# Patient Record
Sex: Female | Born: 1985 | Hispanic: No | Marital: Single | Smoking: Never smoker
Health system: Southern US, Community
[De-identification: ages and names within clinical notes are randomized; demographics above are authoritative.]

## PROBLEM LIST (undated history)

## (undated) DIAGNOSIS — N920 Excessive and frequent menstruation with regular cycle: Secondary | ICD-10-CM

## (undated) DIAGNOSIS — N390 Urinary tract infection, site not specified: Secondary | ICD-10-CM

## (undated) HISTORY — DX: Urinary tract infection, site not specified: N39.0

## (undated) HISTORY — DX: Excessive and frequent menstruation with regular cycle: N92.0

---

## 2014-11-18 ENCOUNTER — Ambulatory Visit (INDEPENDENT_AMBULATORY_CARE_PROVIDER_SITE_OTHER): Payer: Medicaid Other | Admitting: Certified Nurse Midwife

## 2014-11-18 ENCOUNTER — Encounter: Payer: Self-pay | Admitting: Certified Nurse Midwife

## 2014-11-18 VITALS — BP 103/67 | HR 86 | Resp 18 | Ht 63.0 in | Wt 115.0 lb

## 2014-11-18 DIAGNOSIS — Z3009 Encounter for other general counseling and advice on contraception: Secondary | ICD-10-CM | POA: Diagnosis not present

## 2014-11-18 DIAGNOSIS — N898 Other specified noninflammatory disorders of vagina: Secondary | ICD-10-CM | POA: Diagnosis not present

## 2014-11-18 MED ORDER — NORETHIN-ETH ESTRAD-FE BIPHAS 1 MG-10 MCG / 10 MCG PO TABS: 1.0000 | ORAL_TABLET | Freq: Every day | ORAL | Status: DC

## 2014-11-18 MED ORDER — METRONIDAZOLE 500 MG PO TABS: 500.0000 mg | ORAL_TABLET | Freq: Two times a day (BID) | ORAL | Status: DC

## 2014-11-18 NOTE — Progress Notes (Signed)
Pt c/o vaginal discharge with foul smelling odor for a week.  Would also like to discuss birth control options such as the pill.

## 2014-11-18 NOTE — Patient Instructions (Signed)

## 2014-11-18 NOTE — Progress Notes (Signed)
Vaginitis:Sarah Vega 29 y.o. J1P9150 complains of an abnormal vaginal discharge for 1 week. Vaginal symptoms include discharge described as white, malodorous and milky and odor.Vulvar symptoms include none.STI Risk: Very low risk of STD exposureDischarge described as: copious.Other associated symptoms: none.Menstrual pattern: She had been bleeding regularly. Contraception: none Desires to be placed on birth control pills. Review of Systems - Negative except vaginal discharge   BP 103/67 mmHg  Pulse 86  Resp 18  Ht 5\' 3"  (1.6 m)  Wt 115 lb (52.164 kg)  BMI 20.38 kg/m2  LMP 11/05/2014    CONSTITUTIONAL: Well-developed, well-nourished female in no acute distress.  SKIN: Skin is warm and dry. No rash noted. Not diaphoretic. No erythema. No pallor. [place]: Alert and oriented to person, place, and time. Normal reflexes, muscle tone coordination. No cranial nerve deficit noted. PSYCHIATRIC: Normal mood and affect. Normal behavior. Normal judgment and thought content. CARDIOVASCULAR: Normal heart rate noted, regular rhythm RESPIRATORY: Clear to auscultation bilaterally. Effort and breath sounds normal, no problems with respiration noted. PELVIC: Normal appearing external genitalia; normal appearing vaginal mucosa and cervix.  Copious yellowish white discharge noted   Reviewed all forms of birth control options available including abstinence; over the counter/barrier methods; hormonal contraceptive medication including pill, patch, ring, injection,contraceptive implant; hormonal and nonhormonal IUDs; permanent sterilization options including vasectomy and the various tubal sterilization modalities. Risks and benefits reviewed.  Questions were answered.  Information was given to patient to review.   Wet prep showed clue cells consistent with BV, Metronidazole e-prescribed. Patient was called and informed of diagnosis and treatment, and advised to go pick up the prescription. She was counseled  about antabuse reaction that can happen with alcohol and Metronidazole. She was also told to call the clinic with any further questions or further concerns.  Yvonne Kendall CNM

## 2014-11-19 LAB — WET PREP, GENITAL
Trich, Wet Prep: NONE SEEN
Yeast Wet Prep HPF POC: NONE SEEN

## 2015-06-02 ENCOUNTER — Other Ambulatory Visit (HOSPITAL_COMMUNITY)
Admission: RE | Admit: 2015-06-02 | Discharge: 2015-06-02 | Disposition: A | Discharge status: Home / Self Care | Payer: Medicaid Other | Source: Ambulatory Visit | Attending: Obstetrics & Gynecology | Admitting: Obstetrics & Gynecology

## 2015-06-02 ENCOUNTER — Encounter: Payer: Self-pay | Admitting: Obstetrics & Gynecology

## 2015-06-02 ENCOUNTER — Ambulatory Visit (INDEPENDENT_AMBULATORY_CARE_PROVIDER_SITE_OTHER): Payer: Medicaid Other | Admitting: Obstetrics & Gynecology

## 2015-06-02 VITALS — BP 112/73 | HR 89 | Resp 18 | Ht 63.0 in | Wt 124.0 lb

## 2015-06-02 DIAGNOSIS — N760 Acute vaginitis: Secondary | ICD-10-CM

## 2015-06-02 DIAGNOSIS — N898 Other specified noninflammatory disorders of vagina: Secondary | ICD-10-CM | POA: Diagnosis not present

## 2015-06-02 DIAGNOSIS — Z113 Encounter for screening for infections with a predominantly sexual mode of transmission: Secondary | ICD-10-CM | POA: Insufficient documentation

## 2015-06-02 MED ORDER — METRONIDAZOLE 500 MG PO TABS: 500.0000 mg | ORAL_TABLET | Freq: Two times a day (BID) | ORAL | Status: DC

## 2015-06-02 NOTE — Patient Instructions (Signed)
Thank you for enrolling in [facility]. Please follow the instructions below to securely access your online medical record. MyChart allows you to send messages to your doctor, view your test results, manage appointments, and more.   How Do I Sign Up? 1. In your Internet browser, go to [employer] and enter [email or web address]. 2. Click on the Sign Up Now link in the Sign In box. You will see the New Member Sign Up page. 3. Enter your MyChart Access Code exactly as it appears below. You will not need to use this code after you've completed the sign-up process. If you do not sign up before the expiration date, you must request a new code.  MyChart Access Code: [email or web address] Expires: 07/31/2015 10:06 AM  4. Enter your Social Security Number ([id number]) and Date of Birth (mm/dd/yyyy) as indicated and click Submit. You will be taken to the next sign-up page. 5. Create a MyChart ID. This will be your MyChart login ID and cannot be changed, so think of one that is secure and easy to remember. 6. Create a MyChart password. You can change your password at any time. 7. Enter your Password Reset Question and Answer. This can be used at a later time if you forget your password.  8. Enter your e-mail address. You will receive e-mail notification when new information is available in [facility]. 9. Click Sign Up. You can now view your medical record.   Additional Information Remember, MyChart is NOT to be used for urgent needs. For medical emergencies, dial 911.  Vaginitis Vaginitis is an inflammation of the vagina. It is most often caused by a change in the normal balance of the bacteria and yeast that live in the vagina. This change in balance causes an overgrowth of certain bacteria or yeast, which causes the inflammation. There are different types of vaginitis, but the most common types are:  Bacterial vaginosis.  Yeast infection (candidiasis).  Trichomoniasis vaginitis. This is a  sexually transmitted infection (STI).  Viral vaginitis.  Atrophic vaginitis.  Allergic vaginitis. CAUSES  The cause depends on the type of vaginitis. Vaginitis can be caused by:  Bacteria (bacterial vaginosis).  Yeast (yeast infection).  A parasite (trichomoniasis vaginitis)  A virus (viral vaginitis).  Low hormone levels (atrophic vaginitis). Low hormone levels can occur during pregnancy, breastfeeding, or after menopause.  Irritants, such as bubble baths, scented tampons, and feminine sprays (allergic vaginitis). Other factors can change the normal balance of the yeast and bacteria that live in the vagina. These include:  Antibiotic medicines.  Poor hygiene.  Diaphragms, vaginal sponges, spermicides, birth control pills, and intrauterine devices (IUD).  Sexual intercourse.  Infection.  Uncontrolled diabetes.  A weakened immune system. SYMPTOMS  Symptoms can vary depending on the cause of the vaginitis. Common symptoms include:  Abnormal vaginal discharge.  The discharge is white, gray, or yellow with bacterial vaginosis.  The discharge is thick, white, and cheesy with a yeast infection.  The discharge is frothy and yellow or greenish with trichomoniasis.  A bad vaginal odor.  The odor is fishy with bacterial vaginosis.  Vaginal itching, pain, or swelling.  Painful intercourse.  Pain or burning when urinating. Sometimes, there are no symptoms. TREATMENT  Treatment will vary depending on the type of infection.   Bacterial vaginosis and trichomoniasis are often treated with antibiotic creams or pills.  Yeast infections are often treated with antifungal medicines, such as vaginal creams or suppositories.  Viral vaginitis has no cure, but symptoms can be treated with  medicines that relieve discomfort. Your sexual partner should be treated as well.  Atrophic vaginitis may be treated with an estrogen cream, pill, suppository, or vaginal ring. If vaginal  dryness occurs, lubricants and moisturizing creams may help. You may be told to avoid scented soaps, sprays, or douches.  Allergic vaginitis treatment involves quitting the use of the product that is causing the problem. Vaginal creams can be used to treat the symptoms. HOME CARE INSTRUCTIONS   Take all medicines as directed by your caregiver.  Keep your genital area clean and dry. Avoid soap and only rinse the area with water.  Avoid douching. It can remove the healthy bacteria in the vagina.  Do not use tampons or have sexual intercourse until your vaginitis has been treated. Use sanitary pads while you have vaginitis.  Wipe from front to back. This avoids the spread of bacteria from the rectum to the vagina.  Let air reach your genital area.  Wear cotton underwear to decrease moisture buildup.  Avoid wearing underwear while you sleep until your vaginitis is gone.  Avoid tight pants and underwear or nylons without a cotton panel.  Take off wet clothing (especially bathing suits) as soon as possible.  Use mild, non-scented products. Avoid using irritants, such as:  Scented feminine sprays.  Fabric softeners.  Scented detergents.  Scented tampons.  Scented soaps or bubble baths.  Practice safe sex and use condoms. Condoms may prevent the spread of trichomoniasis and viral vaginitis. SEEK MEDICAL CARE IF:   You have abdominal pain.  You have a fever or persistent symptoms for more than 2-3 days.  You have a fever and your symptoms suddenly get worse.   This information is not intended to replace advice given to you by your health care provider. Make sure you discuss any questions you have with your health care provider.   Document Released: 11/27/2006 Document Revised: 06/16/2014 Document Reviewed: 07/13/2011 Elsevier Interactive Patient Education [employer].

## 2015-06-02 NOTE — Progress Notes (Signed)
     CLINIC ENCOUNTER NOTE  History:  30 y.o. G1P1001 here today for vaginal itching and abnormal discharge.  She started experiencing vaginal itching a week ago, was seen in Urgent Care and was presumptively treated with two rounds of Diflucan. Patient is still experiencing the vaginal irritation and now having a white discharge.  She denies any abnormal vaginal bleeding, pelvic pain or other concerns.   Past Medical History  Diagnosis Date  . Frequent UTI     History reviewed. No pertinent past surgical history.  The following portions of the patient's history were reviewed and updated as appropriate: allergies, current medications, past family history, past medical history, past social history, past surgical history and problem list.   Health Maintenance:  Normal pap in 2015.   Review of Systems:  Pertinent items noted in HPI and remainder of comprehensive ROS otherwise negative.  Objective:  Physical Exam BP 112/73 mmHg  Pulse 89  Resp 18  Ht 5\' 3"  (1.6 m)  Wt 124 lb (56.246 kg)  BMI 21.97 kg/m2  LMP 05/19/2015 CONSTITUTIONAL: Well-developed, well-nourished female in no acute distress.  HENT:  Normocephalic, atraumatic. External right and left ear normal. Oropharynx is clear and moist EYES: Conjunctivae and EOM are normal. Pupils are equal, round, and reactive to light. No scleral icterus.  NECK: Normal range of motion, supple, no masses SKIN: Skin is warm and dry. No rash noted. Not diaphoretic. No erythema. No pallor. NEUROLOGIC: Alert and oriented to person, place, and time. Normal reflexes, muscle tone coordination. No cranial nerve deficit noted. PSYCHIATRIC: Normal mood and affect. Normal behavior. Normal judgment and thought content. CARDIOVASCULAR: Normal heart rate noted RESPIRATORY: Effort and breath sounds normal, no problems with respiration noted ABDOMEN: Soft, no distention noted.   PELVIC: Normal appearing external genitalia; normal appearing vaginal mucosa  and cervix.  Copious, thin, yellow malodorous discharge noted; samples obtained.  Normal uterine size, no other palpable masses, no uterine or adnexal tenderness. MUSCULOSKELETAL: Normal range of motion. No edema noted.   Assessment & Plan:  1. Vulvovaginitis 2. Vaginal discharge Metronidazole presumptively treated.  Will follow up wet prep. - metroNIDAZOLE (FLAGYL) 500 MG tablet; Take 1 tablet (500 mg total) by mouth 2 (two) times daily.  Dispense: 14 tablet; Refill: 1 - WET PREP BY MOLECULAR PROBE Proper vulvar hygiene emphasized: discussed avoidance of perfumed soaps, detergents, lotions and any type of douches; in addition to wearing cotton underwear and no underwear at night.  Also recommended cleaning front to back, voiding and cleaning up after intercourse.  Routine preventative health maintenance measures emphasized. Please refer to After Visit Summary for other counseling recommendations.   Return for annual exam and pap soon.   Total face-to-face time with patient: 15 minutes. Over 50% of encounter was spent on counseling and coordination of care.   Verita Schneiders, MD, [place] Attending Obstetrician & Gynecologist, [place] for [facility]

## 2015-06-03 LAB — WET PREP BY MOLECULAR PROBE
CANDIDA SPECIES: NEGATIVE
GARDNERELLA VAGINALIS: POSITIVE — AB
TRICHOMONAS VAG: NEGATIVE

## 2015-06-03 LAB — GC/CHLAMYDIA PROBE AMP (~~LOC~~) NOT AT ARMC
CHLAMYDIA, DNA PROBE: NEGATIVE
NEISSERIA GONORRHEA: NEGATIVE

## 2015-07-06 ENCOUNTER — Ambulatory Visit (INDEPENDENT_AMBULATORY_CARE_PROVIDER_SITE_OTHER): Payer: Medicaid Other | Admitting: Obstetrics & Gynecology

## 2015-07-06 ENCOUNTER — Encounter: Payer: Self-pay | Admitting: Obstetrics & Gynecology

## 2015-07-06 ENCOUNTER — Other Ambulatory Visit (HOSPITAL_COMMUNITY)
Admission: RE | Admit: 2015-07-06 | Discharge: 2015-07-06 | Disposition: A | Discharge status: Home / Self Care | Payer: Medicaid Other | Source: Ambulatory Visit | Attending: Obstetrics & Gynecology | Admitting: Obstetrics & Gynecology

## 2015-07-06 VITALS — BP 108/64 | HR 84 | Resp 18 | Ht 63.0 in | Wt 124.0 lb

## 2015-07-06 DIAGNOSIS — N912 Amenorrhea, unspecified: Secondary | ICD-10-CM | POA: Diagnosis not present

## 2015-07-06 DIAGNOSIS — Z1151 Encounter for screening for human papillomavirus (HPV): Secondary | ICD-10-CM | POA: Diagnosis not present

## 2015-07-06 DIAGNOSIS — Z124 Encounter for screening for malignant neoplasm of cervix: Secondary | ICD-10-CM | POA: Diagnosis not present

## 2015-07-06 DIAGNOSIS — Z01419 Encounter for gynecological examination (general) (routine) without abnormal findings: Secondary | ICD-10-CM | POA: Diagnosis not present

## 2015-07-06 DIAGNOSIS — R87810 Cervical high risk human papillomavirus (HPV) DNA test positive: Secondary | ICD-10-CM | POA: Insufficient documentation

## 2015-07-06 DIAGNOSIS — Z3201 Encounter for pregnancy test, result positive: Secondary | ICD-10-CM

## 2015-07-06 DIAGNOSIS — Z113 Encounter for screening for infections with a predominantly sexual mode of transmission: Secondary | ICD-10-CM | POA: Diagnosis present

## 2015-07-06 DIAGNOSIS — Z36 Encounter for antenatal screening of mother: Secondary | ICD-10-CM | POA: Diagnosis not present

## 2015-07-06 DIAGNOSIS — Z01411 Encounter for gynecological examination (general) (routine) with abnormal findings: Secondary | ICD-10-CM | POA: Diagnosis present

## 2015-07-06 LAB — POCT URINE PREGNANCY: Preg Test, Ur: POSITIVE — AB

## 2015-07-06 MED ORDER — CONCEPT DHA 53.5-38-1 MG PO CAPS
1.0000 | ORAL_CAPSULE | Freq: Every day | ORAL | Status: DC
Start: 2015-07-06 — End: 2015-08-10

## 2015-07-06 NOTE — Patient Instructions (Addendum)
First Trimester of Pregnancy The first trimester of pregnancy is from week 1 until the end of week 12 (months 1 through 3). A week after a sperm fertilizes an egg, the egg will implant on the wall of the uterus. This embryo will begin to develop into a baby. Genes from you and your partner are forming the baby. The female genes determine whether the baby is a boy or a girl. At 6-8 weeks, the eyes and face are formed, and the heartbeat can be seen on ultrasound. At the end of 12 weeks, all the baby's organs are formed.  Now that you are pregnant, you will want to do everything you can to have a healthy baby. Two of the most important things are to get good prenatal care and to follow your health care provider's instructions. Prenatal care is all the medical care you receive before the baby's birth. This care will help prevent, find, and treat any problems during the pregnancy and childbirth. BODY CHANGES Your body goes through many changes during pregnancy. The changes vary from woman to woman.   You may gain or lose a couple of pounds at first.  You may feel sick to your stomach (nauseous) and throw up (vomit). If the vomiting is uncontrollable, call your health care provider.  You may tire easily.  You may develop headaches that can be relieved by medicines approved by your health care provider.  You may urinate more often. Painful urination may mean you have a bladder infection.  You may develop heartburn as a result of your pregnancy.  You may develop constipation because certain hormones are causing the muscles that push waste through your intestines to slow down.  You may develop hemorrhoids or swollen, bulging veins (varicose veins).  Your breasts may begin to grow larger and become tender. Your nipples may stick out more, and the tissue that surrounds them (areola) may become darker.  Your gums may bleed and may be sensitive to brushing and flossing.  Dark spots or blotches (chloasma,  mask of pregnancy) may develop on your face. This will likely fade after the baby is born.  Your menstrual periods will stop.  You may have a loss of appetite.  You may develop cravings for certain kinds of food.  You may have changes in your emotions from day to day, such as being excited to be pregnant or being concerned that something may go wrong with the pregnancy and baby.  You may have more vivid and strange dreams.  You may have changes in your hair. These can include thickening of your hair, rapid growth, and changes in texture. Some women also have hair loss during or after pregnancy, or hair that feels dry or thin. Your hair will most likely return to normal after your baby is born. WHAT TO EXPECT AT YOUR PRENATAL VISITS During a routine prenatal visit:  You will be weighed to make sure you and the baby are growing normally.  Your blood pressure will be taken.  Your abdomen will be measured to track your baby's growth.  The fetal heartbeat will be listened to starting around week 10 or 12 of your pregnancy.  Test results from any previous visits will be discussed. Your health care provider may ask you:  How you are feeling.  If you are feeling the baby move.  If you have had any abnormal symptoms, such as leaking fluid, bleeding, severe headaches, or abdominal cramping.  If you are using any tobacco products,  including cigarettes, chewing tobacco, and electronic cigarettes.  If you have any questions. Other tests that may be performed during your first trimester include:  Blood tests to find your blood type and to check for the presence of any previous infections. They will also be used to check for low iron levels (anemia) and Rh antibodies. Later in the pregnancy, blood tests for diabetes will be done along with other tests if problems develop.  Urine tests to check for infections, diabetes, or protein in the urine.  An ultrasound to confirm the proper growth  and development of the baby.  An amniocentesis to check for possible genetic problems.  Fetal screens for spina bifida and Down syndrome.  You may need other tests to make sure you and the baby are doing well.  HIV (human immunodeficiency virus) testing. Routine prenatal testing includes screening for HIV, unless you choose not to have this test. HOME CARE INSTRUCTIONS  Medicines  Follow your health care provider's instructions regarding medicine use. Specific medicines may be either safe or unsafe to take during pregnancy.  Take your prenatal vitamins as directed.  If you develop constipation, try taking a stool softener if your health care provider approves. Diet  Eat regular, well-balanced meals. Choose a variety of foods, such as meat or vegetable-based protein, fish, milk and low-fat dairy products, vegetables, fruits, and whole grain breads and cereals. Your health care provider will help you determine the amount of weight gain that is right for you.  Avoid raw meat and uncooked cheese. These carry germs that can cause birth defects in the baby.  Eating four or five small meals rather than three large meals a day may help relieve nausea and vomiting. If you start to feel nauseous, eating a few soda crackers can be helpful. Drinking liquids between meals instead of during meals also seems to help nausea and vomiting.  If you develop constipation, eat more high-fiber foods, such as fresh vegetables or fruit and whole grains. Drink enough fluids to keep your urine clear or pale yellow. Activity and Exercise  Exercise only as directed by your health care provider. Exercising will help you:  Control your weight.  Stay in shape.  Be prepared for labor and delivery.  Experiencing pain or cramping in the lower abdomen or low back is a good sign that you should stop exercising. Check with your health care provider before continuing normal exercises.  Try to avoid standing for long  periods of time. Move your legs often if you must stand in one place for a long time.  Avoid heavy lifting.  Wear low-heeled shoes, and practice good posture.  You may continue to have sex unless your health care provider directs you otherwise. Relief of Pain or Discomfort  Wear a good support bra for breast tenderness.   Take warm sitz baths to soothe any pain or discomfort caused by hemorrhoids. Use hemorrhoid cream if your health care provider approves.   Rest with your legs elevated if you have leg cramps or low back pain.  If you develop varicose veins in your legs, wear support hose. Elevate your feet for 15 minutes, 3-4 times a day. Limit salt in your diet. Prenatal Care  Schedule your prenatal visits by the twelfth week of pregnancy. They are usually scheduled monthly at first, then more often in the last 2 months before delivery.  Write down your questions. Take them to your prenatal visits.  Keep all your prenatal visits as directed by your  health care provider. Safety  Wear your seat belt at all times when driving.  Make a list of emergency phone numbers, including numbers for family, friends, the hospital, and police and fire departments. General Tips  Ask your health care provider for a referral to a local prenatal education class. Begin classes no later than at the beginning of month 6 of your pregnancy.  Ask for help if you have counseling or nutritional needs during pregnancy. Your health care provider can offer advice or refer you to specialists for help with various needs.  Do not use hot tubs, steam rooms, or saunas.  Do not douche or use tampons or scented sanitary pads.  Do not cross your legs for long periods of time.  Avoid cat litter boxes and soil used by cats. These carry germs that can cause birth defects in the baby and possibly loss of the fetus by miscarriage or stillbirth.  Avoid all smoking, herbs, alcohol, and medicines not prescribed by  your health care provider. Chemicals in these affect the formation and growth of the baby.  Do not use any tobacco products, including cigarettes, chewing tobacco, and electronic cigarettes. If you need help quitting, ask your health care provider. You may receive counseling support and other resources to help you quit.  Schedule a dentist appointment. At home, brush your teeth with a soft toothbrush and be gentle when you floss. SEEK MEDICAL CARE IF:   You have dizziness.  You have mild pelvic cramps, pelvic pressure, or nagging pain in the abdominal area.  You have persistent nausea, vomiting, or diarrhea.  You have a bad smelling vaginal discharge.  You have pain with urination.  You notice increased swelling in your face, hands, legs, or ankles. SEEK IMMEDIATE MEDICAL CARE IF:   You have a fever.  You are leaking fluid from your vagina.  You have spotting or bleeding from your vagina.  You have severe abdominal cramping or pain.  You have rapid weight gain or loss.  You vomit blood or material that looks like coffee grounds.  You are exposed to [place] measles and have never had them.  You are exposed to fifth disease or chickenpox.  You develop a severe headache.  You have shortness of breath.  You have any kind of trauma, such as from a fall or a car accident.   This information is not intended to replace advice given to you by your health care provider. Make sure you discuss any questions you have with your health care provider.   Document Released: 01/24/2001 Document Revised: 02/20/2014 Document Reviewed: 12/10/2012 Elsevier Interactive Patient Education 2016 [place] for Adults, Female A healthy lifestyle and preventive care can promote health and wellness. Preventive health guidelines for women include the following key practices.  A routine yearly physical is a good way to check with your health care provider about your health  and preventive screening. It is a chance to share any concerns and updates on your health and to receive a thorough exam.  Visit your dentist for a routine exam and preventive care every 6 months. Brush your teeth twice a day and floss once a day. Good oral hygiene prevents tooth decay and gum disease.  The frequency of eye exams is based on your age, health, family medical history, use of contact lenses, and other factors. Follow your health care provider's recommendations for frequency of eye exams.  Eat a healthy diet. Foods like vegetables, fruits, whole  grains, low-fat dairy products, and lean protein foods contain the nutrients you need without too many calories. Decrease your intake of foods high in solid fats, added sugars, and salt. Eat the right amount of calories for you.Get information about a proper diet from your health care provider, if necessary.  Regular physical exercise is one of the most important things you can do for your health. Most adults should get at least 150 minutes of moderate-intensity exercise (any activity that increases your heart rate and causes you to sweat) each week. In addition, most adults need muscle-strengthening exercises on 2 or more days a week.  Maintain a healthy weight. The body mass index (BMI) is a screening tool to identify possible weight problems. It provides an estimate of body fat based on height and weight. Your health care provider can find your BMI and can help you achieve or maintain a healthy weight.For adults 20 years and older:  A BMI below 18.5 is considered underweight.  A BMI of 18.5 to 24.9 is normal.  A BMI of 25 to 29.9 is considered overweight.  A BMI of 30 and above is considered obese.  Maintain normal blood lipids and cholesterol levels by exercising and minimizing your intake of saturated fat. Eat a balanced diet with plenty of fruit and vegetables. Blood tests for lipids and cholesterol should begin at age 24 and be  repeated every 5 years. If your lipid or cholesterol levels are high, you are over 50, or you are at high risk for heart disease, you may need your cholesterol levels checked more frequently.Ongoing high lipid and cholesterol levels should be treated with medicines if diet and exercise are not working.  If you smoke, find out from your health care provider how to quit. If you do not use tobacco, do not start.  Lung cancer screening is recommended for adults aged 41-80 years who are at high risk for developing lung cancer because of a history of smoking. A yearly low-dose CT scan of the lungs is recommended for people who have at least a 30-pack-year history of smoking and are a current smoker or have quit within the past 15 years. A pack year of smoking is smoking an average of 1 pack of cigarettes a day for 1 year (for example: 1 pack a day for 30 years or 2 packs a day for 15 years). Yearly screening should continue until the smoker has stopped smoking for at least 15 years. Yearly screening should be stopped for people who develop a health problem that would prevent them from having lung cancer treatment.  If you are pregnant, do not drink alcohol. If you are breastfeeding, be very cautious about drinking alcohol. If you are not pregnant and choose to drink alcohol, do not have more than 1 drink per day. One drink is considered to be 12 ounces (355 mL) of beer, 5 ounces (148 mL) of wine, or 1.5 ounces (44 mL) of liquor.  Avoid use of street drugs. Do not share needles with anyone. Ask for help if you need support or instructions about stopping the use of drugs.  High blood pressure causes heart disease and increases the risk of stroke. Your blood pressure should be checked at least every 1 to 2 years. Ongoing high blood pressure should be treated with medicines if weight loss and exercise do not work.  If you are 73-52 years old, ask your health care provider if you should take aspirin to prevent  strokes.  Diabetes  screening is done by taking a blood sample to check your blood glucose level after you have not eaten for a certain period of time (fasting). If you are not overweight and you do not have risk factors for diabetes, you should be screened once every 3 years starting at age 16. If you are overweight or obese and you are 36-49 years of age, you should be screened for diabetes every year as part of your cardiovascular risk assessment.  Breast cancer screening is essential preventive care for women. You should practice "breast self-awareness." This means understanding the normal appearance and feel of your breasts and may include breast self-examination. Any changes detected, no matter how small, should be reported to a health care provider. Women in their 70s and 30s should have a clinical breast exam (CBE) by a health care provider as part of a regular health exam every 1 to 3 years. After age 83, women should have a CBE every year. Starting at age 11, women should consider having a mammogram (breast X-ray test) every year. Women who have a family history of breast cancer should talk to their health care provider about genetic screening. Women at a high risk of breast cancer should talk to their health care providers about having an MRI and a mammogram every year.  Breast cancer gene (BRCA)-related cancer risk assessment is recommended for women who have family members with BRCA-related cancers. BRCA-related cancers include breast, ovarian, tubal, and peritoneal cancers. Having family members with these cancers may be associated with an increased risk for harmful changes (mutations) in the breast cancer genes BRCA1 and BRCA2. Results of the assessment will determine the need for genetic counseling and BRCA1 and BRCA2 testing.  Your health care provider may recommend that you be screened regularly for cancer of the pelvic organs (ovaries, uterus, and vagina). This screening involves a pelvic  examination, including checking for microscopic changes to the surface of your cervix (Pap test). You may be encouraged to have this screening done every 3 years, beginning at age 82.  For women ages 48-65, health care providers may recommend pelvic exams and Pap testing every 3 years, or they may recommend the Pap and pelvic exam, combined with testing for human papilloma virus (HPV), every 5 years. Some types of HPV increase your risk of cervical cancer. Testing for HPV may also be done on women of any age with unclear Pap test results.  Other health care providers may not recommend any screening for nonpregnant women who are considered low risk for pelvic cancer and who do not have symptoms. Ask your health care provider if a screening pelvic exam is right for you.  If you have had past treatment for cervical cancer or a condition that could lead to cancer, you need Pap tests and screening for cancer for at least 20 years after your treatment. If Pap tests have been discontinued, your risk factors (such as having a new sexual partner) need to be reassessed to determine if screening should resume. Some women have medical problems that increase the chance of getting cervical cancer. In these cases, your health care provider may recommend more frequent screening and Pap tests.  Colorectal cancer can be detected and often prevented. Most routine colorectal cancer screening begins at the age of 71 years and continues through age 41 years. However, your health care provider may recommend screening at an earlier age if you have risk factors for colon cancer. On a yearly basis, your health care provider  may provide home test kits to check for hidden blood in the stool. Use of a small camera at the end of a tube, to directly examine the colon (sigmoidoscopy or colonoscopy), can detect the earliest forms of colorectal cancer. Talk to your health care provider about this at age 65, when routine screening begins.  Direct exam of the colon should be repeated every 5-10 years through age 18 years, unless early forms of precancerous polyps or small growths are found.  People who are at an increased risk for hepatitis B should be screened for this virus. You are considered at high risk for hepatitis B if:  You were born in a country where hepatitis B occurs often. Talk with your health care provider about which countries are considered high risk.  Your parents were born in a high-risk country and you have not received a shot to protect against hepatitis B (hepatitis B vaccine).  You have HIV or AIDS.  You use needles to inject street drugs.  You live with, or have sex with, someone who has hepatitis B.  You get hemodialysis treatment.  You take certain medicines for conditions like cancer, organ transplantation, and autoimmune conditions.  Hepatitis C blood testing is recommended for all people born from [birth year] through 1966 and any individual with known risks for hepatitis C.  Practice safe sex. Use condoms and avoid high-risk sexual practices to reduce the spread of sexually transmitted infections (STIs). STIs include gonorrhea, chlamydia, syphilis, trichomonas, herpes, HPV, and human immunodeficiency virus (HIV). Herpes, HIV, and HPV are viral illnesses that have no cure. They can result in disability, cancer, and death.  You should be screened for sexually transmitted illnesses (STIs) including gonorrhea and chlamydia if:  You are sexually active and are younger than 24 years.  You are older than 24 years and your health care provider tells you that you are at risk for this type of infection.  Your sexual activity has changed since you were last screened and you are at an increased risk for chlamydia or gonorrhea. Ask your health care provider if you are at risk.  If you are at risk of being infected with HIV, it is recommended that you take a prescription medicine daily to prevent HIV infection.  This is called preexposure prophylaxis (PrEP). You are considered at risk if:  You are sexually active and do not regularly use condoms or know the HIV status of your partner(s).  You take drugs by injection.  You are sexually active with a partner who has HIV.  Talk with your health care provider about whether you are at high risk of being infected with HIV. If you choose to begin PrEP, you should first be tested for HIV. You should then be tested every 3 months for as long as you are taking PrEP.  Osteoporosis is a disease in which the bones lose minerals and strength with aging. This can result in serious bone fractures or breaks. The risk of osteoporosis can be identified using a bone density scan. Women ages 35 years and over and women at risk for fractures or osteoporosis should discuss screening with their health care providers. Ask your health care provider whether you should take a calcium supplement or vitamin D to reduce the rate of osteoporosis.  Menopause can be associated with physical symptoms and risks. Hormone replacement therapy is available to decrease symptoms and risks. You should talk to your health care provider about whether hormone replacement therapy is right for you.  Use sunscreen. Apply sunscreen liberally and repeatedly throughout the day. You should seek shade when your shadow is shorter than you. Protect yourself by wearing long sleeves, pants, a wide-brimmed hat, and sunglasses year round, whenever you are outdoors.  Once a month, do a whole body skin exam, using a mirror to look at the skin on your back. Tell your health care provider of new moles, moles that have irregular borders, moles that are larger than a pencil eraser, or moles that have changed in shape or color.  Stay current with required vaccines (immunizations).  Influenza vaccine. All adults should be immunized every year.  Tetanus, diphtheria, and acellular pertussis (Td, Tdap) vaccine. Pregnant  women should receive 1 dose of Tdap vaccine during each pregnancy. The dose should be obtained regardless of the length of time since the last dose. Immunization is preferred during the 27th-36th week of gestation. An adult who has not previously received Tdap or who does not know her vaccine status should receive 1 dose of Tdap. This initial dose should be followed by tetanus and diphtheria toxoids (Td) booster doses every 10 years. Adults with an unknown or incomplete history of completing a 3-dose immunization series with Td-containing vaccines should begin or complete a primary immunization series including a Tdap dose. Adults should receive a Td booster every 10 years.  Varicella vaccine. An adult without evidence of immunity to varicella should receive 2 doses or a second dose if she has previously received 1 dose. Pregnant females who do not have evidence of immunity should receive the first dose after pregnancy. This first dose should be obtained before leaving the health care facility. The second dose should be obtained 4-8 weeks after the first dose.  Human papillomavirus (HPV) vaccine. Females aged 13-26 years who have not received the vaccine previously should obtain the 3-dose series. The vaccine is not recommended for use in pregnant females. However, pregnancy testing is not needed before receiving a dose. If a female is found to be pregnant after receiving a dose, no treatment is needed. In that case, the remaining doses should be delayed until after the pregnancy. Immunization is recommended for any person with an immunocompromised condition through the age of 29 years if she did not get any or all doses earlier. During the 3-dose series, the second dose should be obtained 4-8 weeks after the first dose. The third dose should be obtained 24 weeks after the first dose and 16 weeks after the second dose.  Zoster vaccine. One dose is recommended for adults aged 50 years or older unless certain  conditions are present.  Measles, mumps, and rubella (MMR) vaccine. Adults born before 34 generally are considered immune to measles and mumps. Adults born in [birth year] or later should have 1 or more doses of MMR vaccine unless there is a contraindication to the vaccine or there is laboratory evidence of immunity to each of the three diseases. A routine second dose of MMR vaccine should be obtained at least 28 days after the first dose for students attending postsecondary schools, health care workers, or international travelers. People who received inactivated measles vaccine or an unknown type of measles vaccine during 1963-1967 should receive 2 doses of MMR vaccine. People who received inactivated mumps vaccine or an unknown type of mumps vaccine before 1979 and are at high risk for mumps infection should consider immunization with 2 doses of MMR vaccine. For females of childbearing age, rubella immunity should be determined. If there is no evidence of  immunity, females who are not pregnant should be vaccinated. If there is no evidence of immunity, females who are pregnant should delay immunization until after pregnancy. Unvaccinated health care workers born before 6 who lack laboratory evidence of measles, mumps, or rubella immunity or laboratory confirmation of disease should consider measles and mumps immunization with 2 doses of MMR vaccine or rubella immunization with 1 dose of MMR vaccine.  Pneumococcal 13-valent conjugate (PCV13) vaccine. When indicated, a person who is uncertain of his immunization history and has no record of immunization should receive the PCV13 vaccine. All adults 81 years of age and older should receive this vaccine. An adult aged 29 years or older who has certain medical conditions and has not been previously immunized should receive 1 dose of PCV13 vaccine. This PCV13 should be followed with a dose of pneumococcal polysaccharide (PPSV23) vaccine. Adults who are at high risk for  pneumococcal disease should obtain the PPSV23 vaccine at least 8 weeks after the dose of PCV13 vaccine. Adults older than 31 years of age who have normal immune system function should obtain the PPSV23 vaccine dose at least 1 year after the dose of PCV13 vaccine.  Pneumococcal polysaccharide (PPSV23) vaccine. When PCV13 is also indicated, PCV13 should be obtained first. All adults aged 60 years and older should be immunized. An adult younger than age 9 years who has certain medical conditions should be immunized. Any person who resides in a nursing home or long-term care facility should be immunized. An adult smoker should be immunized. People with an immunocompromised condition and certain other conditions should receive both PCV13 and PPSV23 vaccines. People with human immunodeficiency virus (HIV) infection should be immunized as soon as possible after diagnosis. Immunization during chemotherapy or radiation therapy should be avoided. Routine use of PPSV23 vaccine is not recommended for American Indians, [place] Natives, or people younger than 65 years unless there are medical conditions that require PPSV23 vaccine. When indicated, people who have unknown immunization and have no record of immunization should receive PPSV23 vaccine. One-time revaccination 5 years after the first dose of PPSV23 is recommended for people aged 19-64 years who have chronic kidney failure, nephrotic syndrome, asplenia, or immunocompromised conditions. People who received 1-2 doses of PPSV23 before age 45 years should receive another dose of PPSV23 vaccine at age 68 years or later if at least 5 years have passed since the previous dose. Doses of PPSV23 are not needed for people immunized with PPSV23 at or after age 30 years.  Meningococcal vaccine. Adults with asplenia or persistent complement component deficiencies should receive 2 doses of quadrivalent meningococcal conjugate (MenACWY-D) vaccine. The doses should be obtained at  least 2 months apart. Microbiologists working with certain meningococcal bacteria, [place] recruits, people at risk during an outbreak, and people who travel to or live in countries with a high rate of meningitis should be immunized. A first-year college student up through age 26 years who is living in a residence hall should receive a dose if she did not receive a dose on or after her 16th birthday. Adults who have certain high-risk conditions should receive one or more doses of vaccine.  Hepatitis A vaccine. Adults who wish to be protected from this disease, have certain high-risk conditions, work with hepatitis A-infected animals, work in hepatitis A research labs, or travel to or work in countries with a high rate of hepatitis A should be immunized. Adults who were previously unvaccinated and who anticipate close contact with an international adoptee during the  first 60 days after arrival in the [place] from a country with a high rate of hepatitis A should be immunized.  Hepatitis B vaccine. Adults who wish to be protected from this disease, have certain high-risk conditions, may be exposed to blood or other infectious body fluids, are household contacts or sex partners of hepatitis B positive people, are clients or workers in certain care facilities, or travel to or work in countries with a high rate of hepatitis B should be immunized.  Haemophilus influenzae type b (Hib) vaccine. A previously unvaccinated person with asplenia or sickle cell disease or having a scheduled splenectomy should receive 1 dose of Hib vaccine. Regardless of previous immunization, a recipient of a hematopoietic stem cell transplant should receive a 3-dose series 6-12 months after her successful transplant. Hib vaccine is not recommended for adults with HIV infection. Preventive Services / Frequency Ages 15 to 50 years  Blood pressure check.** / Every 3-5 years.  Lipid and cholesterol check.** / Every 5 years  beginning at age 44.  Clinical breast exam.** / Every 3 years for women in their 35s and 24s.  BRCA-related cancer risk assessment.** / For women who have family members with a BRCA-related cancer (breast, ovarian, tubal, or peritoneal cancers).  Pap test.** / Every 2 years from ages 35 through 26. Every 3 years starting at age 52 through age 32 or 34 with a history of 3 consecutive normal Pap tests.  HPV screening.** / Every 3 years from ages 69 through ages 47 to 46 with a history of 3 consecutive normal Pap tests.  Hepatitis C blood test.** / For any individual with known risks for hepatitis C.  Skin self-exam. / Monthly.  Influenza vaccine. / Every year.  Tetanus, diphtheria, and acellular pertussis (Tdap, Td) vaccine.** / Consult your health care provider. Pregnant women should receive 1 dose of Tdap vaccine during each pregnancy. 1 dose of Td every 10 years.  Varicella vaccine.** / Consult your health care provider. Pregnant females who do not have evidence of immunity should receive the first dose after pregnancy.  HPV vaccine. / 3 doses over 6 months, if 13 and younger. The vaccine is not recommended for use in pregnant females. However, pregnancy testing is not needed before receiving a dose.  Measles, mumps, rubella (MMR) vaccine.** / You need at least 1 dose of MMR if you were born in 1957 or later. You may also need a 2nd dose. For females of childbearing age, rubella immunity should be determined. If there is no evidence of immunity, females who are not pregnant should be vaccinated. If there is no evidence of immunity, females who are pregnant should delay immunization until after pregnancy.  Pneumococcal 13-valent conjugate (PCV13) vaccine.** / Consult your health care provider.  Pneumococcal polysaccharide (PPSV23) vaccine.** / 1 to 2 doses if you smoke cigarettes or if you have certain conditions.  Meningococcal vaccine.** / 1 dose if you are age 70 to 60 years and a  Market researcher living in a residence hall, or have one of several medical conditions, you need to get vaccinated against meningococcal disease. You may also need additional booster doses.  Hepatitis A vaccine.** / Consult your health care provider.  Hepatitis B vaccine.** / Consult your health care provider.  Haemophilus influenzae type b (Hib) vaccine.** / Consult your health care provider. Ages 80 to 7 years  Blood pressure check.** / Every year.  Lipid and cholesterol check.** / Every 5 years beginning at age 30 years.  Lung cancer screening. / Every year if you are aged 74-80 years and have a 30-pack-year history of smoking and currently smoke or have quit within the past 15 years. Yearly screening is stopped once you have quit smoking for at least 15 years or develop a health problem that would prevent you from having lung cancer treatment.  Clinical breast exam.** / Every year after age 64 years.  BRCA-related cancer risk assessment.** / For women who have family members with a BRCA-related cancer (breast, ovarian, tubal, or peritoneal cancers).  Mammogram.** / Every year beginning at age 38 years and continuing for as long as you are in good health. Consult with your health care provider.  Pap test.** / Every 3 years starting at age 71 years through age 26 or 22 years with a history of 3 consecutive normal Pap tests.  HPV screening.** / Every 3 years from ages 53 years through ages 64 to 4 years with a history of 3 consecutive normal Pap tests.  Fecal occult blood test (FOBT) of stool. / Every year beginning at age 68 years and continuing until age 25 years. You may not need to do this test if you get a colonoscopy every 10 years.  Flexible sigmoidoscopy or colonoscopy.** / Every 5 years for a flexible sigmoidoscopy or every 10 years for a colonoscopy beginning at age 43 years and continuing until age 22 years.  Hepatitis C blood test.** / For all people born from  [birth year] through 1965 and any individual with known risks for hepatitis C.  Skin self-exam. / Monthly.  Influenza vaccine. / Every year.  Tetanus, diphtheria, and acellular pertussis (Tdap/Td) vaccine.** / Consult your health care provider. Pregnant women should receive 1 dose of Tdap vaccine during each pregnancy. 1 dose of Td every 10 years.  Varicella vaccine.** / Consult your health care provider. Pregnant females who do not have evidence of immunity should receive the first dose after pregnancy.  Zoster vaccine.** / 1 dose for adults aged 31 years or older.  Measles, mumps, rubella (MMR) vaccine.** / You need at least 1 dose of MMR if you were born in 1957 or later. You may also need a second dose. For females of childbearing age, rubella immunity should be determined. If there is no evidence of immunity, females who are not pregnant should be vaccinated. If there is no evidence of immunity, females who are pregnant should delay immunization until after pregnancy.  Pneumococcal 13-valent conjugate (PCV13) vaccine.** / Consult your health care provider.  Pneumococcal polysaccharide (PPSV23) vaccine.** / 1 to 2 doses if you smoke cigarettes or if you have certain conditions.  Meningococcal vaccine.** / Consult your health care provider.  Hepatitis A vaccine.** / Consult your health care provider.  Hepatitis B vaccine.** / Consult your health care provider.  Haemophilus influenzae type b (Hib) vaccine.** / Consult your health care provider. Ages 88 years and over  Blood pressure check.** / Every year.  Lipid and cholesterol check.** / Every 5 years beginning at age 10 years.  Lung cancer screening. / Every year if you are aged 19-80 years and have a 30-pack-year history of smoking and currently smoke or have quit within the past 15 years. Yearly screening is stopped once you have quit smoking for at least 15 years or develop a health problem that would prevent you from having lung cancer  treatment.  Clinical breast exam.** / Every year after age 74 years.  BRCA-related cancer risk assessment.** / For women who have family  members with a BRCA-related cancer (breast, ovarian, tubal, or peritoneal cancers).  Mammogram.** / Every year beginning at age 45 years and continuing for as long as you are in good health. Consult with your health care provider.  Pap test.** / Every 3 years starting at age 90 years through age 61 or 52 years with 3 consecutive normal Pap tests. Testing can be stopped between 65 and 70 years with 3 consecutive normal Pap tests and no abnormal Pap or HPV tests in the past 10 years.  HPV screening.** / Every 3 years from ages 35 years through ages 87 or 21 years with a history of 3 consecutive normal Pap tests. Testing can be stopped between 65 and 70 years with 3 consecutive normal Pap tests and no abnormal Pap or HPV tests in the past 10 years.  Fecal occult blood test (FOBT) of stool. / Every year beginning at age 67 years and continuing until age 53 years. You may not need to do this test if you get a colonoscopy every 10 years.  Flexible sigmoidoscopy or colonoscopy.** / Every 5 years for a flexible sigmoidoscopy or every 10 years for a colonoscopy beginning at age 90 years and continuing until age 85 years.  Hepatitis C blood test.** / For all people born from [birth year] through 1965 and any individual with known risks for hepatitis C.  Osteoporosis screening.** / A one-time screening for women ages 60 years and over and women at risk for fractures or osteoporosis.  Skin self-exam. / Monthly.  Influenza vaccine. / Every year.  Tetanus, diphtheria, and acellular pertussis (Tdap/Td) vaccine.** / 1 dose of Td every 10 years.  Varicella vaccine.** / Consult your health care provider.  Zoster vaccine.** / 1 dose for adults aged 76 years or older.  Pneumococcal 13-valent conjugate (PCV13) vaccine.** / Consult your health care provider.  Pneumococcal  polysaccharide (PPSV23) vaccine.** / 1 dose for all adults aged 48 years and older.  Meningococcal vaccine.** / Consult your health care provider.  Hepatitis A vaccine.** / Consult your health care provider.  Hepatitis B vaccine.** / Consult your health care provider.  Haemophilus influenzae type b (Hib) vaccine.** / Consult your health care provider. ** Family history and personal history of risk and conditions may change your health care provider's recommendations.   This information is not intended to replace advice given to you by your health care provider. Make sure you discuss any questions you have with your health care provider.   Document Released: 03/28/2001 Document Revised: 02/20/2014 Document Reviewed: 06/27/2010 Elsevier Interactive Patient Education [employer].

## 2015-07-06 NOTE — Progress Notes (Signed)
GYNECOLOGY CLINIC ANNUAL PREVENTATIVE CARE ENCOUNTER NOTE  Subjective:   Sarah Vega is a 30 y.o. G54P1001 female here for a routine annual gynecologic exam.  Current complaints: missed her period this month, wants to know if she is pregnant.   Denies abnormal vaginal bleeding, discharge, pelvic pain, problems with intercourse or other gynecologic concerns.    Gynecologic History Patient's last menstrual period was 05/18/2015. Contraception: none Last Pap: 2015. Results were: normal  Obstetric History OB History  Gravida Para Term Preterm AB SAB TAB Ectopic Multiple Living  1 1 1  0 0 0 0 0 0 1    # Outcome Date GA Lbr Len/2nd Weight Sex Delivery Anes PTL Lv  1 Term 08/28/14    [place]      Past Medical History  Diagnosis Date  . Frequent UTI     No past surgical history on file.  Current Outpatient Prescriptions on File Prior to Visit  Medication Sig Dispense Refill  . valACYclovir (VALTREX) 500 MG tablet Take 500 mg by mouth daily. Reported on 07/06/2015     No current facility-administered medications on file prior to visit.    Allergies  Allergen Reactions  . Bactrim [Sulfamethoxazole-Trimethoprim] Rash    Social History   Social History  . Marital Status: Single    Spouse Name: N/A  . Number of Children: N/A  . Years of Education: N/A   Occupational History  . Not on file.   Social History Main Topics  . Smoking status: Never Smoker   . Smokeless tobacco: Not on file  . Alcohol Use: No  . Drug Use: No  . Sexual Activity: Yes    Birth Control/ Protection: None   Other Topics Concern  . Not on file   Social History Narrative    No family history on file.  The following portions of the patient's history were reviewed and updated as appropriate: allergies, current medications, past family history, past medical history, past social history, past surgical history and problem list.  Review of Systems Pertinent items noted in HPI and  remainder of comprehensive ROS otherwise negative.   Objective:  BP 108/64 mmHg  Pulse 84  Resp 18  Ht 5\' 3"  (1.6 m)  Wt 124 lb (56.246 kg)  BMI 21.97 kg/m2  LMP 05/18/2015 CONSTITUTIONAL: Well-developed, well-nourished female in no acute distress.  HENT:  Normocephalic, atraumatic, External right and left ear normal. Oropharynx is clear and moist EYES: Conjunctivae and EOM are normal. Pupils are equal, round, and reactive to light. No scleral icterus.  NECK: Normal range of motion, supple, no masses.  Normal thyroid.  SKIN: Skin is warm and dry. No rash noted. Not diaphoretic. No erythema. No pallor. NEUROLOGIC: Alert and oriented to person, place, and time. Normal reflexes, muscle tone coordination. No cranial nerve deficit noted. PSYCHIATRIC: Normal mood and affect. Normal behavior. Normal judgment and thought content. CARDIOVASCULAR: Normal heart rate noted, regular rhythm RESPIRATORY: Clear to auscultation bilaterally. Effort and breath sounds normal, no problems with respiration noted. BREASTS: Symmetric in size. No masses, skin changes, nipple drainage, or lymphadenopathy. ABDOMEN: Soft, normal bowel sounds, no distention noted.  No tenderness, rebound or guarding.  PELVIC: Normal appearing external genitalia; normal appearing vaginal mucosa and cervix.  No abnormal discharge noted.  Pap smear obtained.  Normal uterine size, no other palpable masses, no uterine or adnexal tenderness. MUSCULOSKELETAL: Normal range of motion. No tenderness.  No cyanosis, clubbing, or edema.  2+ distal pulses.  Clinic  UPT: Positive  LMP 05/18/15 -> EDC 02/22/2016  Clinic Bedside Ultrasound: SIUP at [redacted]w[redacted]d, FHR 146 bpm  Assessment:  Annual gynecologic examination with pap smear Incidental pregnancy   Plan:  Will follow up results of pap smear and manage accordingly. Prenatal vitamins prescribed and samples given to patient, will return for initial prenatal visit soon Routine preventative health  maintenance measures emphasized. Please refer to After Visit Summary for other counseling recommendations.    Verita Schneiders, MD, [place] Attending Obstetrician & Gynecologist, [place] for [facility]

## 2015-07-06 NOTE — Progress Notes (Signed)
Pt came in office for annual exam, c/o amenorrhea, + UPT in office. LMP 05-18-15 with EDD 02-22-16 which means pt would be approximately 7 weeks today.  Bedside transvaginal US performed and noted a SIUP measuring 7w 0d and a + FHR=146.  Pt will f/u in 2 weeks for initial OB appt.

## 2015-07-07 LAB — CYTOLOGY - PAP

## 2015-07-16 ENCOUNTER — Encounter: Payer: Self-pay | Admitting: Obstetrics and Gynecology

## 2015-07-16 DIAGNOSIS — R87612 Low grade squamous intraepithelial lesion on cytologic smear of cervix (LGSIL): Secondary | ICD-10-CM | POA: Insufficient documentation

## 2015-07-19 ENCOUNTER — Telehealth: Payer: Self-pay | Admitting: *Deleted

## 2015-07-19 NOTE — Telephone Encounter (Signed)
-----   Message from Mora Bellman, MD sent at 07/16/2015 10:57 AM EDT ----- Please inform the patient of abnormal pap smear and need for colposcopy. Please schedule  Thanks  Vickii Chafe

## 2015-07-19 NOTE — Telephone Encounter (Signed)
Called pt, informed pt of pap result and the need for colposcopy.  Pt will call back to schedule appointment.

## 2015-07-22 ENCOUNTER — Encounter: Payer: Medicaid Other | Admitting: Obstetrics & Gynecology

## 2015-08-10 ENCOUNTER — Encounter: Payer: Self-pay | Admitting: Obstetrics & Gynecology

## 2015-08-10 ENCOUNTER — Ambulatory Visit (INDEPENDENT_AMBULATORY_CARE_PROVIDER_SITE_OTHER): Payer: Medicaid Other | Admitting: Obstetrics & Gynecology

## 2015-08-10 VITALS — BP 114/74 | HR 83 | Resp 16 | Ht 63.0 in | Wt 121.0 lb

## 2015-08-10 DIAGNOSIS — R87612 Low grade squamous intraepithelial lesion on cytologic smear of cervix (LGSIL): Secondary | ICD-10-CM | POA: Diagnosis not present

## 2015-08-10 NOTE — Progress Notes (Signed)
    GYNECOLOGY CLINIC COLPOSCOPY PROCEDURE NOTE  30 y.o. G1P1001 here for colposcopy for low-grade squamous intraepithelial neoplasia (LGSIL - encompassing HPV,mild dysplasia,CIN I) pap smear on 07/06/15. Discussed role for HPV in cervical dysplasia, need for surveillance.  Patient given informed consent, signed copy in the chart, time out was performed.  Placed in lithotomy position. Cervix viewed with speculum and colposcope after application of acetic acid.   Colposcopy adequate? Yes Large acetowhite lesion with possible mosaicism noted at 7 o'clock;two biopsies obtained.  ECC specimen also obtained. All specimens were labeled and sent to pathology.  Patient was given post procedure instructions.  Will follow up pathology and manage accordingly.  Routine preventative health maintenance measures emphasized.    Verita Schneiders, MD, [place] Attending Obstetrician & Gynecologist, [place] for [facility]

## 2015-08-11 ENCOUNTER — Encounter: Payer: Self-pay | Admitting: Obstetrics & Gynecology

## 2015-08-11 ENCOUNTER — Telehealth: Payer: Self-pay | Admitting: *Deleted

## 2015-08-11 DIAGNOSIS — N871 Moderate cervical dysplasia: Secondary | ICD-10-CM | POA: Insufficient documentation

## 2015-08-11 NOTE — Telephone Encounter (Signed)
Informed pt of result and scheduled appt for 09-17-15 for LEEP vs Cryo.

## 2015-08-11 NOTE — Telephone Encounter (Signed)
-----   Message from Osborne Oman, MD sent at 08/11/2015 12:13 PM EDT ----- Colposcopy pathology showed CIN II. Patient needs to come for discussion of results and LEEP vs Cryotherapy.  Please call to inform patient of results and recommendations. Problem list updated.

## 2015-09-17 ENCOUNTER — Encounter: Payer: Self-pay | Admitting: Family Medicine

## 2015-09-17 ENCOUNTER — Ambulatory Visit (INDEPENDENT_AMBULATORY_CARE_PROVIDER_SITE_OTHER): Payer: Medicaid Other | Admitting: Family Medicine

## 2015-09-17 VITALS — BP 119/71 | HR 76 | Ht 64.0 in | Wt 117.0 lb

## 2015-09-17 DIAGNOSIS — N871 Moderate cervical dysplasia: Secondary | ICD-10-CM | POA: Diagnosis not present

## 2015-09-17 DIAGNOSIS — Z01812 Encounter for preprocedural laboratory examination: Secondary | ICD-10-CM | POA: Diagnosis not present

## 2015-09-17 LAB — POCT URINE PREGNANCY: Preg Test, Ur: NEGATIVE

## 2015-09-17 NOTE — Progress Notes (Signed)
   Subjective:    Patient ID: Sarah Vega is a 30 y.o. female presenting with LEEP vs CRYO  on 09/17/2015  HPI: Here for CIN2 on [place]. Still wants to have more kids. Has been spotting x 6 wks. Has an rx for OC's and she wants to start these to stop spotting.  Review of Systems  Constitutional: Negative for chills and fever.  Respiratory: Negative for shortness of breath.   Cardiovascular: Negative for chest pain.  Gastrointestinal: Negative for abdominal pain, nausea and vomiting.  Genitourinary: Negative for dysuria.  Skin: Negative for rash.      Objective:    BP 119/71 (BP Location: Left Arm, Patient Position: Sitting, Cuff Size: Small)   Pulse 76   Ht 5\' 4"  (1.626 m)   Wt 117 lb (53.1 kg)   BMI 20.08 kg/m  Physical Exam  Constitutional: She is oriented to person, place, and time. She appears well-developed and well-nourished. No distress.  HENT:  Head: Normocephalic and atraumatic.  Eyes: No scleral icterus.  Neck: Neck supple.  Cardiovascular: Normal rate.   Pulmonary/Chest: Effort normal.  Abdominal: Soft.  Neurological: She is alert and oriented to person, place, and time.  Skin: Skin is warm and dry.  Psychiatric: She has a normal mood and affect.   UPT negative  Procedure: The patient was placed in the dorsal lithotomy position and a vaginal speculum was placed. Her cervix was visualized and patient was noted to have had normal size transformation zone. The appropriate cryotherapy probe was picked and affixed to cryotherapy apparatus. Then nitrogen gas was then activated, the probe was coated with lubricating jelly and applied to the transformation zone of the cervix. This was kept in place for 3 minutes. The cryotherapy was then stopped and all instruments were removed from the patient's pelvis; a thawing period of 3 minutes was observed.  A second cycle of cryotherapy was then administered to the cervix for 3 minutes.  The patient tolerated the procedure well  without any complications. Routine post procedure instructions were given to the patient.  Will repeat pap smear in 6 months and manage accordingly.     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   Return in about 6 months (around 03/19/2016) for repeat pap.  Riki Berninger S 09/17/2015 11:37 AM

## 2015-09-17 NOTE — Patient Instructions (Signed)
Cryoablation Cryoablation is used to remove abnormal or cancerous tissue by freezing. A probe cooled with liquid nitrogen is directed beneath the skin to the growth. The growth is frozen and destroyed. During cryoablation, liquid nitrogen or argon gas flows into a needlelike applicator (cryoprobe), creating intense cold that is placed in contact with the diseased tissue. Imaging techniques such as an ultrasound, CT scans, or an MRI may be used to help guide the cryoprobes to the proper location inside the body. LET [facility] CARE PROVIDER KNOW ABOUT:   Any allergies you have.   All medicines you are taking, including vitamins, herbs, eye drops, creams, and over-the-counter medicines.  Previous problems you or members of your family have had with the use of anesthetics.  Any blood disorders you have.  Previous surgeries you have had.  Medical conditions you have.  RISKS AND COMPLICATIONS  Generally, this is a safe procedure. However, as with any procedure, complications can occur. Possible complications include:   Infection.  Bleeding.  Tumor is not completely destroyed or the tumor comes back (recurrence). BEFORE THE PROCEDURE   You may need to have blood tests. These tests can help tell how well your kidneys and liver are working. They can also show how well your blood clots.   If you take blood thinners, ask your health care provider when you should stop taking them.   Make arrangements for someone to drive you home. Depending on the procedure, you may be able to go home the same day. However, most people stay overnight in the hospital after this procedure. Ask your health care provider what to expect.  PROCEDURE  This procedure usually takes about 1 to 3 hours.  You will lie on an exam table. You will be connected to monitors that keep track of your heart rate, blood pressure, and breathing throughout the procedure.  An IV access tube will be placed in one of your  veins. You may be given any of the following:  A medicine that makes you sleep through the procedure (general anesthetic).  A medicine to relax you (sedative).  A medicine to numb the area (local anesthetic).   For diseased tissue located deep in the body, your health care provider will use image guidance to insert one or more cryoprobes through the skin to the site of the diseased tissue. The liquid nitrogen or argon gas will then be delivered.  To cause cell death of the diseased tissue, the tissue is repeatedly frozen and thawed. Typically, two freeze-thaw cycles are used. Once the cells are destroyed, the white blood cells of the immune system will work to clear out the dead tissue. AFTER THE PROCEDURE   If you were given a sedative, you will be sleepy.  If you were put under general anesthesia, your throat may be sore after you wake up. This is caused by the breathing tube that was placed in your throat while you were asleep.  You may be allowed to go home when you are awake and able to drink fluids, or you may need to stay in the hospital overnight.  The puncture site will be sore. This is normal. The soreness will go away in 3-5 days. Some minor bruising may also be present.  You may have some mild abdominal, flank, or right shoulder pain for 24 hours.   This information is not intended to replace advice given to you by your health care provider. Make sure you discuss any questions you have with your  health care provider.   Document Released: 11/20/2012 Document Reviewed: 11/20/2012 Elsevier Interactive Patient Education [employer].

## 2015-09-20 ENCOUNTER — Encounter: Payer: Self-pay | Admitting: *Deleted

## 2015-09-23 ENCOUNTER — Ambulatory Visit (INDEPENDENT_AMBULATORY_CARE_PROVIDER_SITE_OTHER): Payer: Medicaid Other | Admitting: Obstetrics & Gynecology

## 2015-09-23 VITALS — BP 113/68 | HR 71

## 2015-09-23 DIAGNOSIS — N9089 Other specified noninflammatory disorders of vulva and perineum: Secondary | ICD-10-CM

## 2015-09-23 DIAGNOSIS — B009 Herpesviral infection, unspecified: Secondary | ICD-10-CM

## 2015-09-23 NOTE — Progress Notes (Signed)
   Subjective:    Patient ID: Sarah Vega, female    DOB: 1985-12-23, 30 y.o.   MRN: [id number]  HPI  30 yo S lady here for a recent onset very red, very painful, very itchy vulva.  She has been wearing panty liners recently after a cryo about 2 weeks ago.  Review of Systems She has valtrex for fever blisters on her mouth and started a course recently.    Objective:   Physical Exam [id number] Breathing, conversing, and ambulating normally Her entire vulva is very erythematous. 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

## 2015-09-24 LAB — HSV 2 ANTIBODY, IGG: HSV 2 Glycoprotein G Ab, IgG: 5.93 Index — ABNORMAL HIGH (ref <0.90)

## 2015-09-27 ENCOUNTER — Telehealth: Payer: Self-pay | Admitting: *Deleted

## 2015-09-27 DIAGNOSIS — A6009 Herpesviral infection of other urogenital tract: Secondary | ICD-10-CM

## 2015-09-27 MED ORDER — VALACYCLOVIR HCL 1 G PO TABS: 1000.0000 mg | ORAL_TABLET | Freq: Two times a day (BID) | ORAL | 6 refills | Status: DC

## 2015-09-27 NOTE — Telephone Encounter (Signed)
-----   Message from Emily Filbert, MD sent at 09/27/2015  8:16 AM EDT ----- I tried to call her this morning about the +HSV2 result but got VM.  Can you let her know and please prescribe valtrex 1 gram BID for a week with 6 refills. Thanks

## 2015-09-27 NOTE — Telephone Encounter (Signed)
Called pt, informed of + HSV and sent Valtrex to pharmacy.  Instructed pt on medication use and precautions.

## 2015-10-01 ENCOUNTER — Telehealth: Payer: Self-pay | Admitting: *Deleted

## 2015-10-01 DIAGNOSIS — N898 Other specified noninflammatory disorders of vagina: Secondary | ICD-10-CM

## 2015-10-01 MED ORDER — FLUCONAZOLE 150 MG PO TABS: 150.0000 mg | ORAL_TABLET | Freq: Once | ORAL | 0 refills | Status: AC

## 2015-10-01 NOTE — Telephone Encounter (Signed)
-----   Message from Francia Greaves sent at 10/01/2015 10:20 AM EDT ----- Regarding: Advise/Rx Request Contact: [phone] Feels like she has a yeast infection, was recently seen in office for a rash

## 2015-10-01 NOTE — Telephone Encounter (Signed)
Sent Diflucan to the pharmacy.  

## 2016-03-13 ENCOUNTER — Encounter: Payer: Self-pay | Admitting: *Deleted

## 2016-03-14 ENCOUNTER — Telehealth: Payer: Self-pay | Admitting: *Deleted

## 2016-03-14 DIAGNOSIS — R102 Pelvic and perineal pain: Secondary | ICD-10-CM

## 2016-03-14 MED ORDER — IBUPROFEN 600 MG PO TABS: 600.0000 mg | ORAL_TABLET | Freq: Four times a day (QID) | ORAL | 2 refills | Status: DC | PRN

## 2016-03-14 NOTE — Telephone Encounter (Signed)
-----   Message from Blanchie Dessert, [place] sent at 03/13/2016  4:34 PM EST ----- Regarding: Rx Refill Contact: [phone] Can you please call a refill in for Motrin 800 to [facility] in [place].  However, just FYI when I look back to see when the order was written, it was written back in  08/29/14 by a "historical Provider"

## 2016-03-14 NOTE — Telephone Encounter (Signed)
Sent rx for Ibuprofen to the pharmacy per pt request to help with cramping associated with her menstrual cycles.

## 2016-03-14 NOTE — Telephone Encounter (Signed)
-----   Message from Blanchie Dessert, [place] sent at 03/13/2016  4:34 PM EST ----- Regarding: Rx Refill Contact: [phone] Can you please call a refill in for Motrin [facility] to [facility] [place].  However, just FYI when I look back to see when the order was written, it was written back in  08/29/14 by a "historical Provider"

## 2016-03-28 ENCOUNTER — Ambulatory Visit: Payer: Medicaid Other | Admitting: Family Medicine

## 2016-04-19 ENCOUNTER — Ambulatory Visit: Payer: Medicaid Other | Admitting: Family Medicine

## 2016-04-27 ENCOUNTER — Encounter: Payer: Self-pay | Admitting: Obstetrics & Gynecology

## 2016-04-27 ENCOUNTER — Other Ambulatory Visit (HOSPITAL_COMMUNITY)
Admission: RE | Admit: 2016-04-27 | Discharge: 2016-04-27 | Disposition: A | Discharge status: Home / Self Care | Payer: Medicaid Other | Source: Ambulatory Visit | Attending: Obstetrics & Gynecology | Admitting: Obstetrics & Gynecology

## 2016-04-27 ENCOUNTER — Ambulatory Visit (INDEPENDENT_AMBULATORY_CARE_PROVIDER_SITE_OTHER): Payer: Medicaid Other | Admitting: Obstetrics & Gynecology

## 2016-04-27 VITALS — BP 114/70 | HR 91 | Wt 118.0 lb

## 2016-04-27 DIAGNOSIS — N871 Moderate cervical dysplasia: Secondary | ICD-10-CM

## 2016-04-27 DIAGNOSIS — Z1151 Encounter for screening for human papillomavirus (HPV): Secondary | ICD-10-CM | POA: Diagnosis not present

## 2016-04-27 DIAGNOSIS — Z01419 Encounter for gynecological examination (general) (routine) without abnormal findings: Secondary | ICD-10-CM | POA: Diagnosis present

## 2016-04-27 NOTE — Progress Notes (Signed)
   GYNECOLOGY OFFICE VISIT NOTE  History:  31 y.o. 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 She denies any abnormal vaginal discharge, bleeding, pelvic pain or other concerns.   Past Medical History:  Diagnosis Date  . Frequent UTI     History reviewed. No pertinent surgical history.  The following portions of the patient's history were reviewed and updated as appropriate: allergies, current medications, past family history, past medical history, past social history, past surgical history and problem list.   Review of Systems:  Pertinent items noted in HPI and remainder of comprehensive ROS otherwise negative.   Objective:  Physical Exam BP 114/70 (BP Location: Left Arm, Patient Position: Sitting, Cuff Size: Normal)   Pulse 91   Wt 118 lb (53.5 kg)   LMP 04/02/2016   BMI 20.25 kg/m   CONSTITUTIONAL: Well-developed, well-nourished female in no acute distress.  HENT:  Normocephalic, atraumatic. External right and left ear normal. Oropharynx is clear and moist EYES: Conjunctivae and EOM are normal. Pupils are equal, round, and reactive to light. No scleral icterus.  NECK: Normal range of motion, supple, no masses SKIN: Skin is warm and dry. No rash noted. Not diaphoretic. No erythema. No pallor. NEUROLOGIC: Alert and oriented to person, place, and time. Normal reflexes, muscle tone coordination. No cranial nerve deficit noted. PSYCHIATRIC: Normal mood and affect. Normal behavior. Normal judgment and thought content. CARDIOVASCULAR: Normal heart rate noted RESPIRATORY: Effort and breath sounds normal, no problems with respiration noted ABDOMEN: Soft, no distention noted.   PELVIC: Normal appearing external genitalia; normal appearing vaginal mucosa and cervix. Pap smear done, there was minimal bleeding after pap.  No abnormal discharge noted. MUSCULOSKELETAL: Normal range  of motion. No edema noted.  Assessment & Plan:  1. Moderate dysplasia of cervix (CIN II) - Cytology - PAP done, will follow up results and manage accordingly. Patient understands repeat colposcopy and further procedures may be indicated for persistent cervical dysplasia.   Verita Schneiders, MD, [place] Attending [place], [facility] for [employer], [place]

## 2016-04-27 NOTE — Patient Instructions (Signed)
Return to clinic for any scheduled appointments or for any gynecologic concerns as needed.   

## 2016-05-02 LAB — CYTOLOGY - PAP
Diagnosis: NEGATIVE
HPV (WINDOPATH): DETECTED — AB
HPV 16/18/45 genotyping: POSITIVE — AB

## 2016-05-03 ENCOUNTER — Telehealth: Payer: Self-pay | Admitting: *Deleted

## 2016-05-03 ENCOUNTER — Encounter: Payer: Self-pay | Admitting: Obstetrics & Gynecology

## 2016-05-03 DIAGNOSIS — R87810 Cervical high risk human papillomavirus (HPV) DNA test positive: Secondary | ICD-10-CM | POA: Insufficient documentation

## 2016-05-03 NOTE — Telephone Encounter (Signed)
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  Patient will need colposcopy. Please call to inform patient of results and recommendations.

## 2016-05-03 NOTE — Telephone Encounter (Signed)
Called pt, no answer, left message to call the office.  

## 2016-05-03 NOTE — Telephone Encounter (Signed)
Pt returned call, informed her of results and recommendation to repeat pap smear.  Scheduled appt for 06-29-16 with Dr Harolyn Rutherford.

## 2016-06-29 ENCOUNTER — Ambulatory Visit (INDEPENDENT_AMBULATORY_CARE_PROVIDER_SITE_OTHER): Payer: Medicaid Other | Admitting: Obstetrics & Gynecology

## 2016-06-29 ENCOUNTER — Encounter: Payer: Self-pay | Admitting: Obstetrics & Gynecology

## 2016-06-29 VITALS — BP 112/75 | HR 96 | Wt 119.0 lb

## 2016-06-29 DIAGNOSIS — B977 Papillomavirus as the cause of diseases classified elsewhere: Secondary | ICD-10-CM

## 2016-06-29 DIAGNOSIS — R87810 Cervical high risk human papillomavirus (HPV) DNA test positive: Secondary | ICD-10-CM | POA: Diagnosis not present

## 2016-06-29 DIAGNOSIS — Z3202 Encounter for pregnancy test, result negative: Secondary | ICD-10-CM | POA: Diagnosis not present

## 2016-06-29 DIAGNOSIS — R87610 Atypical squamous cells of undetermined significance on cytologic smear of cervix (ASC-US): Secondary | ICD-10-CM

## 2016-06-29 DIAGNOSIS — N920 Excessive and frequent menstruation with regular cycle: Secondary | ICD-10-CM

## 2016-06-29 LAB — POCT URINE PREGNANCY: Preg Test, Ur: NEGATIVE

## 2016-06-29 NOTE — Progress Notes (Signed)
   Subjective:    Patient ID: Sarah Vega, female    DOB: 16-Sep-1985, 31 y.o.   MRN: [id number]  HPI  31 yo here for a colpo due to ASCUS + HR HPV pap recently. She had CIN2 on biopsy with colpo 6/17 and was treated with cryo 8/17 by Dr. Kennon Rounds.  She also complains of her periods becoming more heavy with clotting.  Review of Systems     Objective:   Physical Exam  UPT negative, consent signed, time out done Cervix prepped with acetic acid. Transformation zone seen in its entirety. Colpo adequate. punctation and mosacism seen at the 3 and and 4 o'clock positions There was dense acetowhite area just inside the external os at the 11 o'clock postitions. I biopsied the 3 and 11 o'clock positions ECC obtained. She tolerated the procedure well.     Assessment & Plan:  Recurrent cervical dysplasia- rec a MVI daily, await pathology Menorrhagia- check CBC, TSH, gyn u/s RTC 4 weeks for results and treatment plan

## 2016-06-30 LAB — CBC
Hematocrit: 38.5 % (ref 34.0–46.6)
Hemoglobin: 12.3 g/dL (ref 11.1–15.9)
MCH: 26.5 pg — ABNORMAL LOW (ref 26.6–33.0)
MCHC: 31.9 g/dL (ref 31.5–35.7)
MCV: 83 fL (ref 79–97)
Platelets: 380 10*3/uL — ABNORMAL HIGH (ref 150–379)
RBC: 4.65 x10E6/uL (ref 3.77–5.28)
RDW: 13.7 % (ref 12.3–15.4)
WBC: 4.7 10*3/uL (ref 3.4–10.8)

## 2016-06-30 LAB — TSH: TSH: 1.33 u[IU]/mL (ref 0.450–4.500)

## 2016-07-06 ENCOUNTER — Ambulatory Visit
Admission: RE | Admit: 2016-07-06 | Discharge: 2016-07-06 | Disposition: A | Discharge status: Home / Self Care | Payer: Medicaid Other | Source: Ambulatory Visit | Attending: Obstetrics & Gynecology | Admitting: Obstetrics & Gynecology

## 2016-07-06 DIAGNOSIS — N920 Excessive and frequent menstruation with regular cycle: Secondary | ICD-10-CM | POA: Insufficient documentation

## 2016-08-03 ENCOUNTER — Encounter: Payer: Self-pay | Admitting: Obstetrics & Gynecology

## 2016-08-03 ENCOUNTER — Ambulatory Visit (INDEPENDENT_AMBULATORY_CARE_PROVIDER_SITE_OTHER): Payer: Medicaid Other | Admitting: Obstetrics & Gynecology

## 2016-08-03 VITALS — BP 108/70 | HR 88 | Wt 118.0 lb

## 2016-08-03 DIAGNOSIS — F39 Unspecified mood [affective] disorder: Secondary | ICD-10-CM | POA: Diagnosis not present

## 2016-08-03 DIAGNOSIS — N920 Excessive and frequent menstruation with regular cycle: Secondary | ICD-10-CM

## 2016-08-03 DIAGNOSIS — N871 Moderate cervical dysplasia: Secondary | ICD-10-CM

## 2016-08-03 DIAGNOSIS — D069 Carcinoma in situ of cervix, unspecified: Secondary | ICD-10-CM | POA: Diagnosis not present

## 2016-08-03 DIAGNOSIS — F0630 Mood disorder due to known physiological condition, unspecified: Secondary | ICD-10-CM

## 2016-08-03 DIAGNOSIS — D219 Benign neoplasm of connective and other soft tissue, unspecified: Secondary | ICD-10-CM | POA: Insufficient documentation

## 2016-08-03 DIAGNOSIS — D252 Subserosal leiomyoma of uterus: Secondary | ICD-10-CM

## 2016-08-03 MED ORDER — NORETHIN ACE-ETH ESTRAD-FE 1-20 MG-MCG(24) PO TABS: 1.0000 | ORAL_TABLET | Freq: Every day | ORAL | 11 refills | Status: AC

## 2016-08-03 NOTE — Progress Notes (Signed)
GYNECOLOGY OFFICE VISIT NOTE  History:  31 y.o. G1P1001 here today for discussion of management of cervical dysplasia as per below: 07/06/15  LGSIL pap 08/10/15 Colposcopy CIN II, negative ECC 09/15/15 Cryotherapy 04/27/16 Normal pap but positive HRHPV 16 06/29/16 Colposcopy CIN II/III fragments seen, negative ECC Patient also was evaluated for menorrhagia at last visit; had labs and pelvic ultrasound and wants to review results.  She reports having heavy bleeding with clots on first three days of cycles for the past several months.  No syncope symptoms.  Associated with mood disorder; feels she is less patient and had increased anger especially the week prior to her cycle and during her cycle. Wants to know what she can do to help this.  She denies any current abnormal vaginal discharge, pelvic pain or other concerns.   Past Medical History:  Diagnosis Date  . Frequent UTI   . Menorrhagia     History reviewed. No pertinent surgical history.  The following portions of the patient's history were reviewed and updated as appropriate: allergies, current medications, past family history, past medical history, past social history, past surgical history and problem list.   Review of Systems:  Pertinent items noted in HPI and remainder of comprehensive ROS otherwise negative.   Objective:  Physical Exam BP 108/70   Pulse 88   Wt 118 lb (53.5 kg)   LMP 07/22/2016   Breastfeeding? No   BMI 20.25 kg/m  CONSTITUTIONAL: Well-developed, well-nourished female in no acute distress.  HENT:  Normocephalic, atraumatic. External right and left ear normal. Oropharynx is clear and moist EYES: Conjunctivae and EOM are normal. Pupils are equal, round, and reactive to light. No scleral icterus.  NECK: Normal range of motion, supple, no masses SKIN: Skin is warm and dry. No rash noted. Not diaphoretic. No erythema. No pallor. NEUROLOGIC: Alert and oriented to person, place, and time. Normal reflexes,  muscle tone coordination. No cranial nerve deficit noted. PSYCHIATRIC: Normal mood and affect. Normal behavior. Normal judgment and thought content. CARDIOVASCULAR: Normal heart rate noted RESPIRATORY: Effort and breath sounds normal, no problems with respiration noted ABDOMEN: Soft, no distention noted.   PELVIC: Deferred MUSCULOSKELETAL: Normal range of motion. No edema noted.  Labs and Imaging  Colposcopy Pathology Diagnosis 1. Cervix, biopsy, 3 o'clock - LOW GRADE SQUAMOUS INTRAEPITHELIAL LESION, CIN-I (MILD DYSPLASIA). 2. Endocervix, curettage - PROLIFERATIVE ENDOMETRIUM. - NO HYPERPLASIA OR MALIGNANCY. 3. Cervix, biopsy, 11 o'clock - FRAGMENTS OF HIGH GRADE SQUAMOUS INTRAEPITHELIAL LESION. The fragments are detached and thus exact grading is hampered, but the features are consistent with a high grade squamous intraepithelial lesion (CIN-II to CIN-III).  Results for orders placed or performed in visit on 06/29/16 (from the past 1344 hour(s))  CBC   Collection Time: 06/29/16 10:36 AM  Result Value Ref Range   WBC 4.7 3.4 - 10.8 x10E3/uL   RBC 4.65 3.77 - 5.28 x10E6/uL   Hemoglobin 12.3 11.1 - 15.9 g/dL   Hematocrit 38.5 34.0 - 46.6 %   MCV 83 79 - 97 fL   MCH 26.5 (L) 26.6 - 33.0 pg   MCHC 31.9 31.5 - 35.7 g/dL   RDW 13.7 12.3 - 15.4 %   Platelets 380 (H) 150 - 379 x10E3/uL  TSH   Collection Time: 06/29/16 10:36 AM  Result Value Ref Range   TSH 1.330 0.450 - 4.500 uIU/mL  POCT urine pregnancy   Collection Time: 06/29/16 10:44 AM  Result Value Ref Range   Preg Test, Ur Negative Negative  US Transvaginal Non-ob  Result Date: 07/06/2016 CLINICAL DATA:  Chronic menorrhagia.  Initial encounter. EXAM: TRANSABDOMINAL AND TRANSVAGINAL ULTRASOUND OF PELVIS TECHNIQUE: Both transabdominal and transvaginal ultrasound examinations of the pelvis were performed. Transabdominal technique was performed for global imaging of the pelvis including uterus, ovaries, adnexal regions, and  pelvic cul-de-sac. It was necessary to proceed with endovaginal exam following the transabdominal exam to visualize the uterus and right ovary in greater detail. COMPARISON:  None FINDINGS: Uterus Measurements: 8.4 x 4.6 x 6.3 cm. There appears to be a pedunculated fibroid arising at the right posterior aspect of the uterus, with peripheral calcification, measuring 3.5 x 2.9 x 3.1 cm. Endometrium Thickness: 1.1 cm.  No focal abnormality visualized. Right ovary Measurements: 3.7 x 2.1 x 2.4 cm. Normal appearance/no adnexal mass. Left ovary Measurements: 3.1 x 2.1 x 2.1 cm. Normal appearance/no adnexal mass. Other findings Trace free fluid is seen within the pelvic cul-de-sac. IMPRESSION: 1. No acute abnormality seen within the pelvis. No evidence for ovarian torsion. 2. Apparent pedunculated fibroid arising at the right posterior aspect of the uterus, with peripheral calcification, measuring 3.5 cm. Electronically Signed   By: Garald Balding M.D.   On: 07/06/2016 19:42   US Pelvis Complete  Result Date: 07/06/2016 CLINICAL DATA:  Chronic menorrhagia.  Initial encounter. EXAM: TRANSABDOMINAL AND TRANSVAGINAL ULTRASOUND OF PELVIS TECHNIQUE: Both transabdominal and transvaginal ultrasound examinations of the pelvis were performed. Transabdominal technique was performed for global imaging of the pelvis including uterus, ovaries, adnexal regions, and pelvic cul-de-sac. It was necessary to proceed with endovaginal exam following the transabdominal exam to visualize the uterus and right ovary in greater detail. COMPARISON:  None FINDINGS: Uterus Measurements: 8.4 x 4.6 x 6.3 cm. There appears to be a pedunculated fibroid arising at the right posterior aspect of the uterus, with peripheral calcification, measuring 3.5 x 2.9 x 3.1 cm. Endometrium Thickness: 1.1 cm.  No focal abnormality visualized. Right ovary Measurements: 3.7 x 2.1 x 2.4 cm. Normal appearance/no adnexal mass. Left ovary Measurements: 3.1 x 2.1 x 2.1 cm.  Normal appearance/no adnexal mass. Other findings Trace free fluid is seen within the pelvic cul-de-sac. IMPRESSION: 1. No acute abnormality seen within the pelvis. No evidence for ovarian torsion. 2. Apparent pedunculated fibroid arising at the right posterior aspect of the uterus, with peripheral calcification, measuring 3.5 cm. Electronically Signed   By: Garald Balding M.D.   On: 07/06/2016 19:42    Assessment & Plan:  1. Severe dysplasia of cervix (CIN III) 2. Moderate dysplasia of cervix (CIN II) LEEP recommended, patient did not want to do this today and will not be available until early 09/2016.  This will be scheduled on 09/14/2016. Advised to take Ibuprofen 600 mg prior to procedure. Details of procedure reviewed in detail, all questions answered. Information given to her to review at home.   3. Menorrhagia with regular cycle 4. 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 Pedunculated leiomyoma of uterus Ultrasound results reviewed with patient; she has a 3.5 cm pedunculated fibroid.  Surgery not indicated for the fibroid at this point. Normal CBC and TSH last month.  Discussed fertility-sparing management options for abnormal uterine bleeding including tranexamic acid (Lysteda), OCPs, oral progesterone (Megace), Depo Provera, Mirena IUD, and myomectomy.  Discussed risks and benefits of each method.   Patient desires OCPs for now; this was also recommended to help with her menstrual-related mood disorder. Discussed possible side effects of OCPs.  Mood and bleeding precautions reviewed.  - Norethindrone Acetate-Ethinyl Estrad-FE (LOESTRIN 24 FE)  1-20 MG-MCG(24) tablet; Take 1 tablet by mouth daily.  Dispense: 1 Package; Refill: 11  Routine preventative health maintenance measures emphasized. Please refer to After Visit Summary for other counseling recommendations.   Return in about 6 weeks (around 09/14/2016) for LEEP with Dr. Harolyn Rutherford. Will also follow up response to OCPs at that time.    Total  face-to-face time with patient: 25 minutes. Over 50% of encounter was spent on counseling and coordination of care.   Verita Schneiders, MD, [place] Attending [place], [facility] for [employer], [place]

## 2016-08-03 NOTE — Patient Instructions (Signed)
Cervical Conization Cervical conization (cone biopsy) is a procedure in which a cone-shaped portion of the cervix is cut out so that it can be examined under a microscope. The procedure is done to check for cancer cells or cells that might turn into cancer (precancerous cells). You may have this procedure if:  You have abnormal bleeding from your cervix.  You had an abnormal Pap test.  Something abnormal was seen on your cervix during an exam. This procedure is performed in either a health care provider's office or in an operating room. Tell a health care provider about:  Any allergies you have.  All medicines you are taking, including vitamins, herbs, eye drops, creams, and over-the-counter medicines.  Any problems you or family members have had with the use of anesthetic medicines.  Any blood disorders you have.  Any surgeries you have had.  Any medical conditions you have.  Your smoking habits.  When you normally have your period.  Whether you are pregnant or may be pregnant. What are the risks? Generally, this is a safe procedure. However, problems may occur, including:  Heavy bleeding for several days or weeks after the procedure.  Allergic reactions to medicines or dyes.  Increased risk of preterm labor in future pregnancies.  Infection (rare).  Damage to the cervix or other structures or organs (rare).  What happens before the procedure? General instructions  Do not douche, have sex, use tampons, or use any vaginal medicines before the procedure as told by your health care provider.  You may be asked to empty your bladder and bowel right before the procedure.  Ask your health care provider about: ? Changing or stopping your normal medicines. This is important if you take diabetes medicines or blood thinners.  Plan to have someone take you home from the hospital or clinic. What happens during the procedure?  To reduce your risk of infection: ? Your health  care team will wash or sanitize their hands. ? Your skin will be washed with soap. ? Hair may be removed from the surgical area.  You will undress from the waist down and be given a gown to wear.  You will lie on an examining table and put your feet in stirrups.  An IV tube will be inserted into one of your veins.  You will be given one or more of the following: ? A medicine to help you relax (sedative). ? A medicine to numb the area (local anesthetic). ? A medicine to make you fall asleep (general anesthetic). ? A medicine that numbs the cervix (cervical block).  A lubricated device called a speculum will be inserted into your vagina. It will be used to spread open the walls of the vagina so your health care provider can see the inside of the vagina and cervix better.  An instrument that has a magnifying lens and a light (colposcope) will let your health care provider examine the cervix more closely.  Your health care provider will apply a solution to your cervix. This turns abnormal areas a pale color.  A tissue sample will be removed from the cervix using one of the following methods: ? The cold knife method. In this method, the tissue is cut out with a knife (scalpel). ? The loop electrosurgical excision procedure (LEEP) method. In this method, the tissue is cut out with a thin wire that can burn (cauterize) the tissue with an electrical current. ? Laser treatment method. In this method, the tissue is cut out  and then cauterized with a laser beam to prevent bleeding.  Your health care provider will apply a paste over the biopsy areas to help control bleeding.  The tissue sample will be examined under a microscope. The procedure may vary among health care providers and hospitals. What happens after the procedure?  Your blood pressure, heart rate, breathing rate, and blood oxygen level will be monitored often until the medicines you were given have worn off.  If you were given a  local anesthetic, you will rest at the clinic or hospital until you are stable and feel ready to go home.  If you were given a general anesthetic, you may be monitored for a longer period of time.  You may have some cramping.  You may have bloody discharge or light to moderate bleeding.  You may have dark discharge coming from your vagina. This is from the paste used on the cervix to prevent bleeding. Summary  Cervical conization is a procedure in which a cone-shaped portion of the cervix is cut out so that it can be examined under a microscope.  The procedure is done to check for cancer cells or cells that might turn into cancer (precancerous cells). This information is not intended to replace advice given to you by your health care provider. Make sure you discuss any questions you have with your health care provider. Document Released: 11/09/2004 Document Revised: 02/02/2016 Document Reviewed: 02/02/2016 Elsevier Interactive Patient Education  2017 [employer].

## 2016-09-14 ENCOUNTER — Ambulatory Visit (INDEPENDENT_AMBULATORY_CARE_PROVIDER_SITE_OTHER): Payer: Medicaid Other | Admitting: Obstetrics & Gynecology

## 2016-09-14 ENCOUNTER — Encounter: Payer: Self-pay | Admitting: Obstetrics & Gynecology

## 2016-09-14 VITALS — BP 108/71 | HR 102 | Wt 122.0 lb

## 2016-09-14 DIAGNOSIS — Z01812 Encounter for preprocedural laboratory examination: Secondary | ICD-10-CM

## 2016-09-14 DIAGNOSIS — Z3202 Encounter for pregnancy test, result negative: Secondary | ICD-10-CM

## 2016-09-14 DIAGNOSIS — D069 Carcinoma in situ of cervix, unspecified: Secondary | ICD-10-CM

## 2016-09-14 LAB — POCT URINE PREGNANCY: Preg Test, Ur: NEGATIVE

## 2016-09-14 NOTE — Progress Notes (Signed)
   GYNECOLOGY OFFICE PROCEDURE NOTE  Sarah Vega is a 31 y.o. G1P1001 here for LEEP. No GYN concerns. Pap smear and colposcopy reviewed.    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  Role of HPV,cervical dysplasia and need for close followup was empasized. Informed written consent was obtained. All questions were answered. Time out performed. Urine pregnancy test was negative.  Procedure: The patient was placed in lithotomy position and the bivalved coated speculum was placed in the patient's vagina. A grounding pad placed on the patient. Lugol's solution was applied to the cervix and a small area of decreased uptake were noted around the transformation zone in the 5 o'clock position.   Local anesthesia was administered via an intracervical block using 10 cc of 2% Lidocaine with epinephrine. The suction was turned on and the Medium 1X Fisher Cone Biopsy Excisor on 38 Watts of cutting current was used to excise the area of decreased uptake and excise the entire transformation zone. Excellent hemostasis was achieved using roller ball coagulation set at 60 Watts coagulation current. Monsel's solution was then applied and the speculum was removed from the vagina. Specimens were sent to pathology.  The patient tolerated the procedure well. Post-operative instructions given to patient, including instruction to seek medical attention for persistent bright red bleeding, fever, abdominal/pelvic pain, dysuria, nausea or vomiting. She was also told about the possibility of having copious yellow to black tinged  discharge for weeks. She was counseled to avoid anything in the vagina (sex/douching/tampons) for 3 weeks. She has a 4 week post-operative check to assess wound healing, review results and discuss further management.     Verita Schneiders, MD, [place] Attending Obstetrician & Gynecologist, [place] for [facility]

## 2016-09-14 NOTE — Patient Instructions (Signed)
LEEP POST-PROCEDURE INSTRUCTIONS  1. You may take Ibuprofen, Aleve or Tylenol for pain if needed.  Cramping is normal.  2. You will have black and/or bloody discharge at first.  This will lighten and then turn clear before completely resolving.  This will take 2 to 3 weeks.  3. Put nothing in your vagina until 3 weeks.  4. You need to call if you have redness around the biopsy site, if there is any unusual draining, if the bleeding is heavy, or if you are concerned.  5. Shower or bathe as normal  6. We will call you within one week with results or we will discuss the results at your follow-up appointment if needed.  7. You will need to return for a follow-up Pap smear as directed by your physician.

## 2016-09-28 ENCOUNTER — Other Ambulatory Visit: Payer: Self-pay

## 2016-09-28 MED ORDER — METRONIDAZOLE 500 MG PO TABS: 500.0000 mg | ORAL_TABLET | Freq: Two times a day (BID) | ORAL | 0 refills | Status: AC

## 2016-09-28 NOTE — Telephone Encounter (Signed)
Patient called complaining of milky discharge with odor. No itching, no burning.  Flagyl sent to pharmacy per bacterial vaginosis protocol.

## 2016-10-05 ENCOUNTER — Ambulatory Visit: Payer: Medicaid Other | Admitting: Obstetrics and Gynecology

## 2016-10-05 ENCOUNTER — Ambulatory Visit (INDEPENDENT_AMBULATORY_CARE_PROVIDER_SITE_OTHER): Payer: Medicaid Other | Admitting: Obstetrics and Gynecology

## 2016-10-05 ENCOUNTER — Other Ambulatory Visit (HOSPITAL_COMMUNITY)
Admission: RE | Admit: 2016-10-05 | Discharge: 2016-10-05 | Disposition: A | Discharge status: Home / Self Care | Payer: Medicaid Other | Source: Ambulatory Visit | Attending: Obstetrics and Gynecology | Admitting: Obstetrics and Gynecology

## 2016-10-05 VITALS — BP 104/70 | HR 92 | Ht 64.0 in | Wt 123.0 lb

## 2016-10-05 DIAGNOSIS — N760 Acute vaginitis: Secondary | ICD-10-CM

## 2016-10-05 NOTE — Progress Notes (Signed)
31 yo s/p LEEP on 8/2 for the evaluation of a non-pruritic creamy vaginal discharge with a slight odor. Patient is without other complaints. She did not start the prescribed flagyl in fear of developing a yeast infection. She wanted to get tested first  Past Medical History:  Diagnosis Date  . Frequent UTI   . Menorrhagia    No past surgical history on file. Family History  Problem Relation Age of Onset  . Cancer Maternal Grandmother        Thyroid  . Cancer Maternal Aunt 56       breast   Social History  Substance Use Topics  . Smoking status: Never Smoker  . Smokeless tobacco: Never Used  . Alcohol use Yes     Comment: occasional   ROS See pertinent in HPI  Blood pressure 104/70, pulse 92, height 5\' 4"  (1.626 m), weight 123 lb (55.8 kg), last menstrual period 09/17/2016. GENERAL: Well-developed, well-nourished female in no acute distress.  ABDOMEN: Soft, nontender, nondistended.  PELVIC: Normal external female genitalia. Vagina is pink and rugated.  Abundant creamy discharge. Normal appearing cervix. LEEP site nearly completely healed.  Uterus is normal in size.  No adnexal mass or tenderness. EXTREMITIES: No cyanosis, clubbing, or edema, 2+ distal pulses.  A/P 31 yo s/p LEEP here with vaginitis - wet prep collected - patient will be contacted with results - RTC in 6 months for repeat pap smear

## 2016-10-05 NOTE — Progress Notes (Signed)
Still very cramping post LEEP.  Having some discharge with odor.

## 2016-10-06 LAB — CERVICOVAGINAL ANCILLARY ONLY
Bacterial vaginitis: NEGATIVE
Candida vaginitis: NEGATIVE
Chlamydia: NEGATIVE
NEISSERIA GONORRHEA: NEGATIVE
TRICH (WINDOWPATH): NEGATIVE

## 2016-10-27 ENCOUNTER — Other Ambulatory Visit: Payer: Self-pay | Admitting: Obstetrics & Gynecology

## 2016-10-27 DIAGNOSIS — A6009 Herpesviral infection of other urogenital tract: Secondary | ICD-10-CM

## 2016-10-30 MED ORDER — VALACYCLOVIR HCL 500 MG PO TABS: 500.0000 mg | ORAL_TABLET | Freq: Every day | ORAL | 12 refills | Status: DC

## 2017-01-11 ENCOUNTER — Encounter: Payer: Self-pay | Admitting: Radiology

## 2017-11-15 ENCOUNTER — Other Ambulatory Visit: Payer: Self-pay | Admitting: *Deleted

## 2017-11-15 DIAGNOSIS — R102 Pelvic and perineal pain: Secondary | ICD-10-CM

## 2017-11-15 DIAGNOSIS — A6009 Herpesviral infection of other urogenital tract: Secondary | ICD-10-CM

## 2017-11-15 MED ORDER — VALACYCLOVIR HCL 500 MG PO TABS: 500.0000 mg | ORAL_TABLET | Freq: Every day | ORAL | 12 refills | Status: AC

## 2017-11-15 MED ORDER — IBUPROFEN 600 MG PO TABS: 600.0000 mg | ORAL_TABLET | Freq: Four times a day (QID) | ORAL | 2 refills | Status: AC | PRN

## 2017-11-15 NOTE — Telephone Encounter (Signed)
Pt called requesting refills on her Mortin 600mg  and Valtrex 500mg , pt away at school in [place] and did have her follow up Pap in [place]. Spoke with Dr Harolyn Rutherford, ok to give patient her refills. Sent to pharmacy in [place].    Crosby Oyster, RN

## 2018-06-20 IMAGING — US US PELVIS COMPLETE
1 series · 13 of 25 positions shown · non-contrast
Comparison: None

CLINICAL DATA: Chronic menorrhagia.  Initial encounter.

EXAM:
TRANSABDOMINAL AND TRANSVAGINAL ULTRASOUND OF PELVIS
TECHNIQUE: Both transabdominal and transvaginal ultrasound examinations of the
pelvis were performed. Transabdominal technique was performed for
global imaging of the pelvis including uterus, ovaries, adnexal
regions, and pelvic cul-de-sac. It was necessary to proceed with
endovaginal exam following the transabdominal exam to visualize the
uterus and right ovary in greater detail.

[Series 1: us pelvis complete · 0.18mm/px · 13 of 131 slices shown]
[im 1/131]
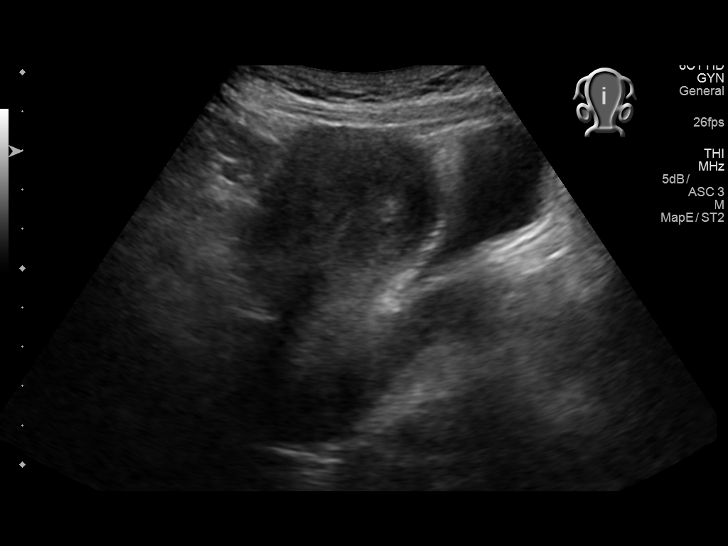
[im 11/131]
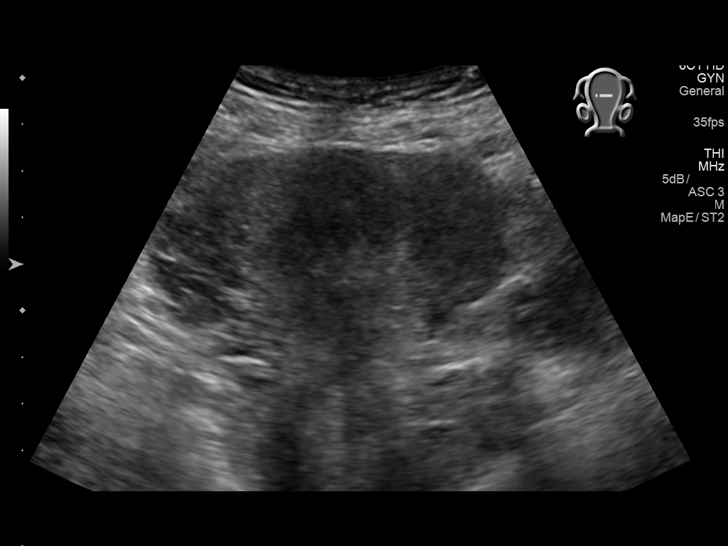
[im 22/131]
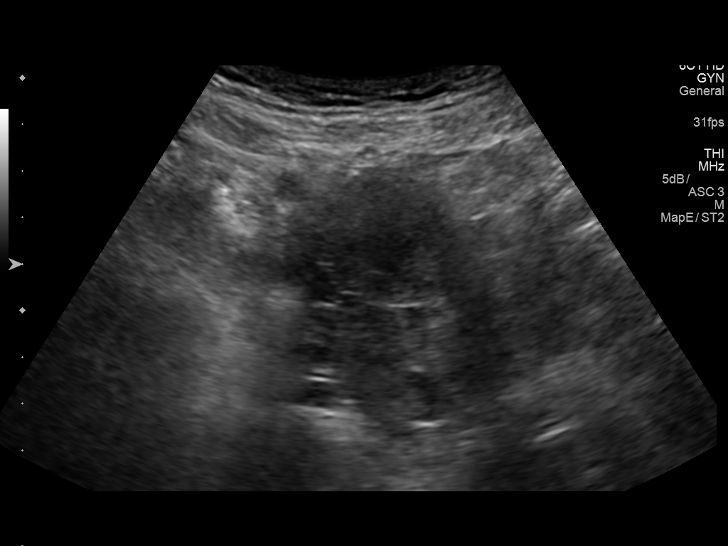
[im 33/131]
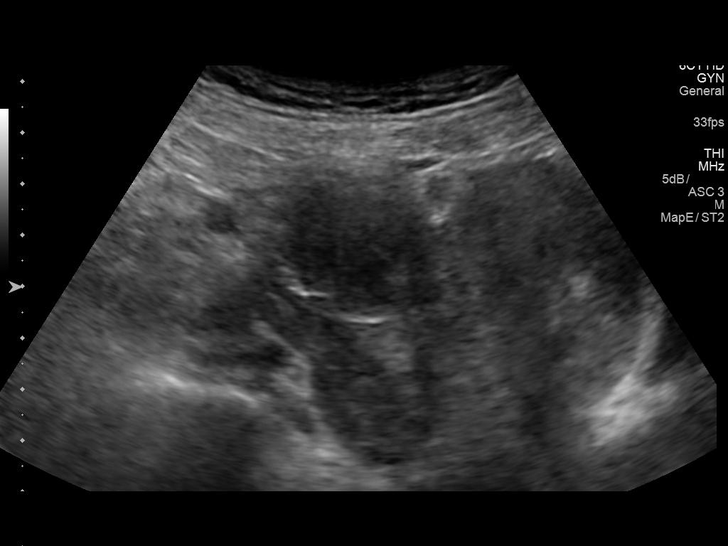
[im 44/131]
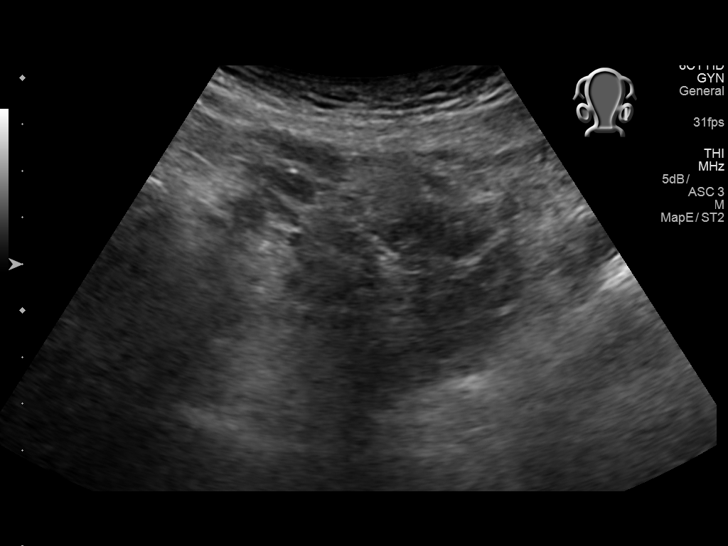
[im 55/131]
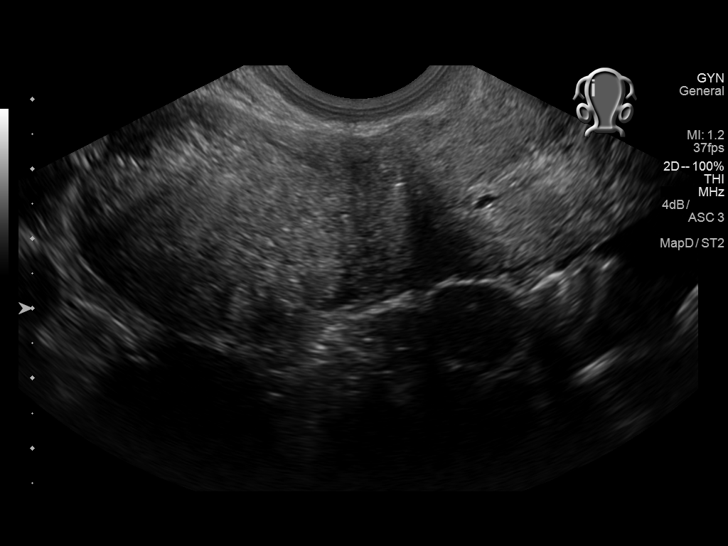
[im 66/131]
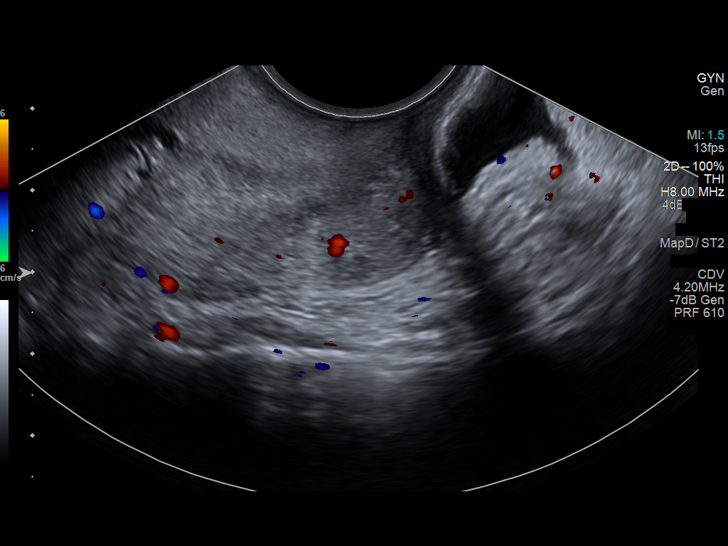
[im 76/131]
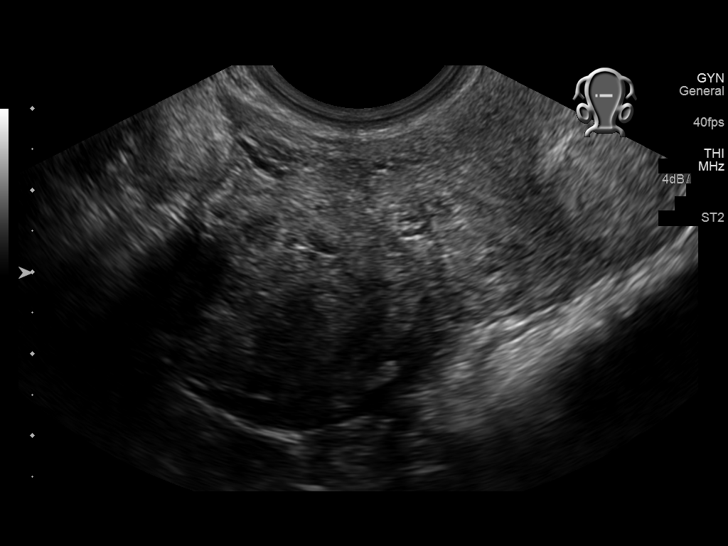
[im 87/131]
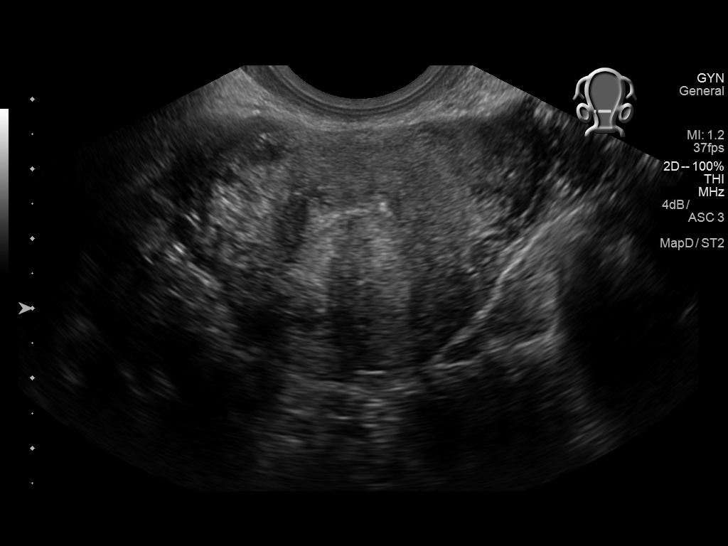
[im 98/131]
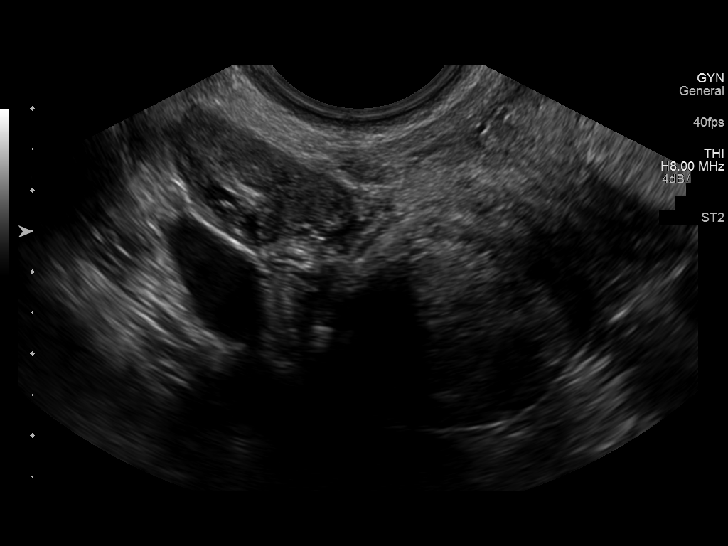
[im 109/131]
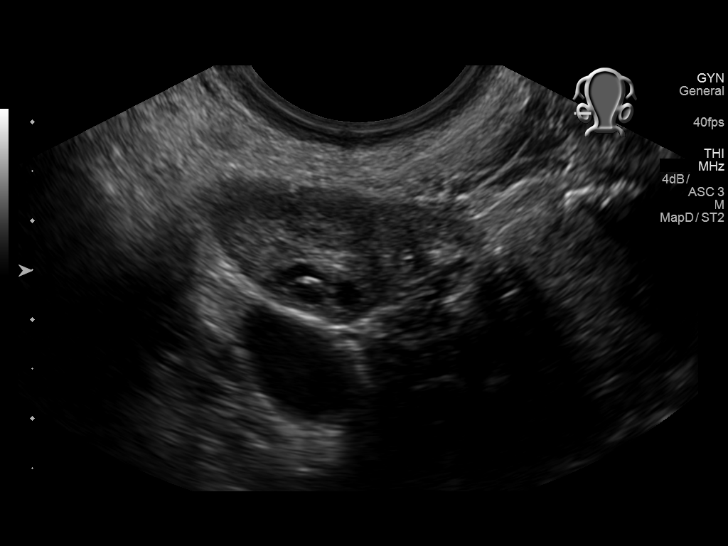
[im 120/131]
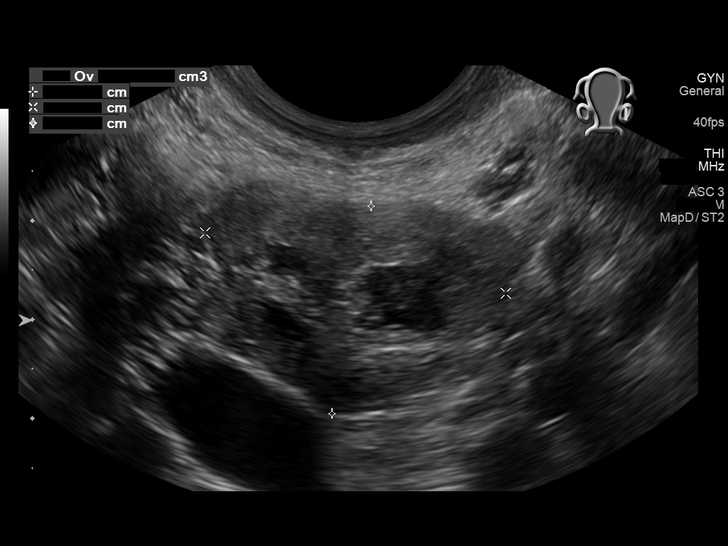
[im 131/131]
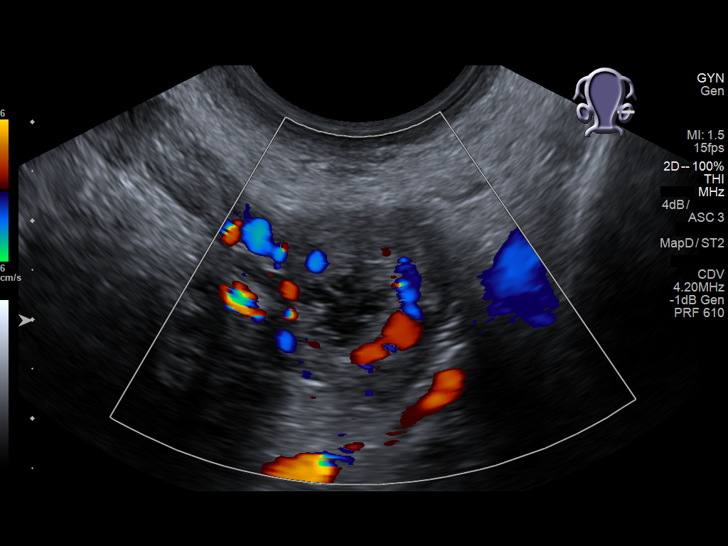

[13 of 25 positions shown; findings below may reference images not displayed]

FINDINGS: Uterus

Measurements: 8.4 x 4.6 x 6.3 cm. There appears to be a pedunculated
fibroid arising at the right posterior aspect of the uterus, with
peripheral calcification, measuring 3.5 x 2.9 x 3.1 cm.

Endometrium

Thickness: 1.1 cm.  No focal abnormality visualized.

Right ovary

Measurements: 3.7 x 2.1 x 2.4 cm. Normal appearance/no adnexal mass.

Left ovary

Measurements: 3.1 x 2.1 x 2.1 cm. Normal appearance/no adnexal mass.

Other findings

Trace free fluid is seen within the pelvic cul-de-sac.
IMPRESSION: 1. No acute abnormality seen within the pelvis. No evidence for
ovarian torsion.
2. Apparent pedunculated fibroid arising at the right posterior
aspect of the uterus, with peripheral calcification, measuring
cm.
